# Patient Record
Sex: Female | Born: 1970 | Race: White | Hispanic: No | Smoking: Current some day smoker
Health system: Southern US, Community
[De-identification: ages and names within clinical notes are randomized; demographics above are authoritative.]

## PROBLEM LIST (undated history)

## (undated) DIAGNOSIS — N760 Acute vaginitis: Secondary | ICD-10-CM

## (undated) DIAGNOSIS — B9689 Other specified bacterial agents as the cause of diseases classified elsewhere: Secondary | ICD-10-CM

## (undated) DIAGNOSIS — F1910 Other psychoactive substance abuse, uncomplicated: Secondary | ICD-10-CM

## (undated) DIAGNOSIS — B1920 Unspecified viral hepatitis C without hepatic coma: Secondary | ICD-10-CM

## (undated) DIAGNOSIS — F418 Other specified anxiety disorders: Secondary | ICD-10-CM

## (undated) DIAGNOSIS — B009 Herpesviral infection, unspecified: Secondary | ICD-10-CM

## (undated) DIAGNOSIS — T401X1A Poisoning by heroin, accidental (unintentional), initial encounter: Secondary | ICD-10-CM

## (undated) HISTORY — PX: TUMOR REMOVAL: SHX12

## (undated) HISTORY — DX: Acute vaginitis: N76.0

## (undated) HISTORY — DX: Herpesviral infection, unspecified: B00.9

## (undated) HISTORY — DX: Other specified bacterial agents as the cause of diseases classified elsewhere: B96.89

## (undated) HISTORY — PX: CHOLECYSTECTOMY: SHX55

---

## 2002-05-02 ENCOUNTER — Emergency Department (HOSPITAL_COMMUNITY): Admission: EM | Admit: 2002-05-02 | Discharge: 2002-05-02 | Payer: Self-pay | Admitting: Emergency Medicine

## 2003-11-03 ENCOUNTER — Emergency Department (HOSPITAL_COMMUNITY): Admission: EM | Admit: 2003-11-03 | Discharge: 2003-11-03 | Payer: Self-pay

## 2004-03-07 ENCOUNTER — Emergency Department (HOSPITAL_COMMUNITY): Admission: EM | Admit: 2004-03-07 | Discharge: 2004-03-07 | Payer: Self-pay | Admitting: Emergency Medicine

## 2004-03-28 ENCOUNTER — Emergency Department (HOSPITAL_COMMUNITY): Admission: EM | Admit: 2004-03-28 | Discharge: 2004-03-28 | Payer: Self-pay | Admitting: Emergency Medicine

## 2005-01-14 ENCOUNTER — Emergency Department (HOSPITAL_COMMUNITY): Admission: EM | Admit: 2005-01-14 | Discharge: 2005-01-14 | Payer: Self-pay | Admitting: Emergency Medicine

## 2006-04-02 ENCOUNTER — Emergency Department (HOSPITAL_COMMUNITY): Admission: EM | Admit: 2006-04-02 | Discharge: 2006-04-02 | Payer: Self-pay | Admitting: Emergency Medicine

## 2006-04-21 ENCOUNTER — Emergency Department (HOSPITAL_COMMUNITY): Admission: EM | Admit: 2006-04-21 | Discharge: 2006-04-21 | Payer: Self-pay | Admitting: Emergency Medicine

## 2006-06-12 ENCOUNTER — Encounter (HOSPITAL_COMMUNITY): Admission: RE | Admit: 2006-06-12 | Discharge: 2006-07-12 | Payer: Self-pay | Admitting: Family Medicine

## 2007-10-14 ENCOUNTER — Emergency Department (HOSPITAL_COMMUNITY): Admission: EM | Admit: 2007-10-14 | Discharge: 2007-10-14 | Payer: Self-pay | Admitting: Emergency Medicine

## 2007-10-16 ENCOUNTER — Emergency Department (HOSPITAL_COMMUNITY): Admission: EM | Admit: 2007-10-16 | Discharge: 2007-10-16 | Payer: Self-pay | Admitting: Emergency Medicine

## 2008-12-30 ENCOUNTER — Emergency Department (HOSPITAL_COMMUNITY): Admission: EM | Admit: 2008-12-30 | Discharge: 2008-12-30 | Payer: Self-pay | Admitting: Emergency Medicine

## 2008-12-31 ENCOUNTER — Ambulatory Visit (HOSPITAL_COMMUNITY): Admission: RE | Admit: 2008-12-31 | Discharge: 2008-12-31 | Payer: Self-pay | Admitting: Emergency Medicine

## 2009-01-22 ENCOUNTER — Ambulatory Visit: Payer: Self-pay | Admitting: General Surgery

## 2009-01-27 ENCOUNTER — Ambulatory Visit: Payer: Self-pay | Admitting: General Surgery

## 2009-06-21 ENCOUNTER — Inpatient Hospital Stay: Payer: Self-pay | Admitting: Unknown Physician Specialty

## 2010-04-06 ENCOUNTER — Ambulatory Visit (HOSPITAL_COMMUNITY): Admission: RE | Admit: 2010-04-06 | Discharge: 2010-04-06 | Payer: Self-pay | Admitting: Nurse Practitioner

## 2010-11-28 ENCOUNTER — Encounter: Payer: Self-pay | Admitting: Emergency Medicine

## 2010-11-28 ENCOUNTER — Encounter: Payer: Self-pay | Admitting: Nurse Practitioner

## 2011-02-22 LAB — DIFFERENTIAL
Basophils Relative: 0 % (ref 0–1)
Eosinophils Relative: 4 % (ref 0–5)
Monocytes Absolute: 0.8 10*3/uL (ref 0.1–1.0)
Monocytes Relative: 8 % (ref 3–12)

## 2011-02-22 LAB — URINALYSIS, ROUTINE W REFLEX MICROSCOPIC
Glucose, UA: NEGATIVE mg/dL
Hgb urine dipstick: NEGATIVE
Ketones, ur: NEGATIVE mg/dL
Nitrite: NEGATIVE
Protein, ur: NEGATIVE mg/dL
pH: 6 (ref 5.0–8.0)

## 2011-02-22 LAB — COMPREHENSIVE METABOLIC PANEL
AST: 229 U/L — ABNORMAL HIGH (ref 0–37)
Albumin: 3.9 g/dL (ref 3.5–5.2)
CO2: 27 mEq/L (ref 19–32)
Creatinine, Ser: 0.57 mg/dL (ref 0.4–1.2)
GFR calc Af Amer: >60 mL/min (ref >60)
Glucose, Bld: 82 mg/dL (ref 70–99)
Potassium: 3.8 mEq/L (ref 3.5–5.1)
Sodium: 140 mEq/L (ref 135–145)
Total Bilirubin: 0.5 mg/dL (ref 0.3–1.2)
Total Protein: 7 g/dL (ref 6.0–8.3)

## 2011-02-22 LAB — CBC
MCV: 93.6 fL (ref 78.0–100.0)
RDW: 12.5 % (ref 11.5–15.5)

## 2015-02-09 ENCOUNTER — Encounter: Payer: Self-pay | Admitting: Women's Health

## 2015-02-09 ENCOUNTER — Other Ambulatory Visit: Payer: Self-pay | Admitting: Women's Health

## 2015-02-25 ENCOUNTER — Other Ambulatory Visit: Payer: Self-pay | Admitting: Women's Health

## 2015-03-10 ENCOUNTER — Other Ambulatory Visit: Payer: Self-pay | Admitting: Women's Health

## 2015-03-18 ENCOUNTER — Other Ambulatory Visit: Payer: Self-pay | Admitting: Women's Health

## 2015-03-24 ENCOUNTER — Other Ambulatory Visit: Payer: Self-pay | Admitting: Women's Health

## 2015-03-31 ENCOUNTER — Other Ambulatory Visit (HOSPITAL_COMMUNITY)
Admission: RE | Admit: 2015-03-31 | Discharge: 2015-03-31 | Disposition: A | Discharge status: Home / Self Care | Payer: Medicaid Other | Source: Ambulatory Visit | Attending: Obstetrics & Gynecology | Admitting: Obstetrics & Gynecology

## 2015-03-31 ENCOUNTER — Encounter: Payer: Self-pay | Admitting: Women's Health

## 2015-03-31 ENCOUNTER — Ambulatory Visit (INDEPENDENT_AMBULATORY_CARE_PROVIDER_SITE_OTHER): Payer: Medicaid Other | Admitting: Women's Health

## 2015-03-31 VITALS — BP 94/62 | HR 64 | Ht 65.75 in | Wt 133.0 lb

## 2015-03-31 DIAGNOSIS — Z8639 Personal history of other endocrine, nutritional and metabolic disease: Secondary | ICD-10-CM

## 2015-03-31 DIAGNOSIS — Z30431 Encounter for routine checking of intrauterine contraceptive device: Secondary | ICD-10-CM

## 2015-03-31 DIAGNOSIS — Z113 Encounter for screening for infections with a predominantly sexual mode of transmission: Secondary | ICD-10-CM | POA: Insufficient documentation

## 2015-03-31 DIAGNOSIS — Z01419 Encounter for gynecological examination (general) (routine) without abnormal findings: Secondary | ICD-10-CM

## 2015-03-31 DIAGNOSIS — F17200 Nicotine dependence, unspecified, uncomplicated: Secondary | ICD-10-CM | POA: Insufficient documentation

## 2015-03-31 DIAGNOSIS — Z308 Encounter for other contraceptive management: Secondary | ICD-10-CM | POA: Diagnosis not present

## 2015-03-31 DIAGNOSIS — Z01411 Encounter for gynecological examination (general) (routine) with abnormal findings: Secondary | ICD-10-CM | POA: Diagnosis present

## 2015-03-31 DIAGNOSIS — Z1151 Encounter for screening for human papillomavirus (HPV): Secondary | ICD-10-CM | POA: Diagnosis present

## 2015-03-31 NOTE — Progress Notes (Signed)
Patient ID: Alyssa Norris, female   DOB: November 23, 1970, 44 y.o.   MRN: [id number] Subjective:   Alyssa Norris is a 44 y.o. G77P2012 Caucasian female here for a routine well-woman exam.  No LMP recorded. Patient is not currently having periods (Reason: IUD).    IUD placed 2010, wants to remove and have new one placed Current complaints: none PCP: [facility], however her PCP left       Does desire labs. Has h/o thyroid nodule- normal thyroid scan/uptake 24hr 2007, doesn't think she's had tsh since them.  Has CBC & CMET that she had done in Feb at ED in [place], they were normal  Social History: Sexual: heterosexual Marital Status: dating Living situation: with mother Occupation: unemployed Tobacco/alcohol: tobacco: may 2-3 cigarettes about twice/wk, etoh: occ Illicit drugs: no history of illicit drug use  The following portions of the patient's history were reviewed and updated as appropriate: allergies, current medications, past family history, past medical history, past social history, past surgical history and problem list.  Past Medical History History reviewed. No pertinent past medical history.  Past Surgical History Past Surgical History  Procedure Laterality Date  . Tumor removal      abdomen @ age 22  . Cholecystectomy      Gynecologic History No obstetric history on file.  No LMP recorded. Patient is not currently having periods (Reason: IUD). Contraception: IUD, Mirena placed 2010 in [place] Last Pap: >74yrs ago at [facility]. Results were: normal Last mammogram: @ 44yo 'baseline'. Results were: normal Last TCS: never  Obstetric History OB History  No data available    Current Medications No current outpatient prescriptions on file prior to visit.   No current facility-administered medications on file prior to visit.    Review of Systems Patient denies any headaches, blurred vision, shortness of breath, chest pain, abdominal  pain, problems with bowel movements, urination, or intercourse.  Objective:  BP 94/62 mmHg  Pulse 64  Ht 5' 5.75" (1.67 m)  Wt 133 lb (60.328 kg)  BMI 21.63 kg/m2 Physical Exam  General:  Well developed, well nourished, no acute distress. She is alert and oriented x3. Skin:  Warm and dry Neck:  Midline trachea, no thyromegaly or nodules Cardiovascular: Regular rate and rhythm, no murmur heard Lungs:  Effort normal, all lung fields clear to auscultation bilaterally Breasts:  No dominant palpable mass, retraction, or nipple discharge ~1cm mole Rt outer lateral breast, one color, mostly regular Abdomen:  Soft, non tender, no hepatosplenomegaly or masses Pelvic:  External genitalia is normal in appearance.  The vagina is normal in appearance. The cervix is bulbous, no CMT.  Thin prep pap is done w/ HR HPV cotesting. Uterus is felt to be normal size, shape, and contour.  No adnexal masses or tenderness noted. Extremities:  No swelling or varicosities noted Psych:  She has a normal mood and affect  Assessment:   Healthy well-woman exam STI screening H/O hyperthyroidism w/ thyroid nodule Large nevi Rt breast Smoker  Plan:  GC/CT from pap today F/U 1wk for fasting labs (TSH & Lipid panel- will be self pay d/t FP [facility] not covering- gave expected prices for both), HIV, RPR, HSV2, HepB in AM, then appt for IUD removal and reinsertion Mammogram: needs screening mammo now, past due, however her insurance won't pay- gave info for BCEP @ [facility] or scholarship program at [facility] Recommended seeing derm for large nevi Rt breast Colonoscopy  or sooner if problems Advised complete smoking  cessation  Marge DuncansBooker, Kimberly Randall CNM, [facility]NP-BC 03/31/2015 3:43 PM

## 2015-03-31 NOTE — Patient Instructions (Signed)
TSH (thyroid level) $ 43, Lipid panel (cholesterol levels) $40.81, $10 fee to draw your blood  Nothing to eat or drink after midnight the night before you come for labs  Mammogram: [facility] and ask about the [place], [phone] (or [place]), [facility] [place]- [facility]

## 2015-04-02 LAB — CYTOLOGY - PAP

## 2015-04-08 ENCOUNTER — Ambulatory Visit: Payer: Medicaid Other | Admitting: Women's Health

## 2015-04-14 ENCOUNTER — Ambulatory Visit: Payer: Medicaid Other | Admitting: Women's Health

## 2015-04-22 ENCOUNTER — Ambulatory Visit (INDEPENDENT_AMBULATORY_CARE_PROVIDER_SITE_OTHER): Payer: Medicaid Other | Admitting: Women's Health

## 2015-04-22 ENCOUNTER — Encounter: Payer: Self-pay | Admitting: Women's Health

## 2015-04-22 VITALS — BP 104/62 | HR 68 | Wt 133.0 lb

## 2015-04-22 DIAGNOSIS — Z30433 Encounter for removal and reinsertion of intrauterine contraceptive device: Secondary | ICD-10-CM

## 2015-04-22 DIAGNOSIS — Z309 Encounter for contraceptive management, unspecified: Secondary | ICD-10-CM

## 2015-04-22 DIAGNOSIS — Z30430 Encounter for insertion of intrauterine contraceptive device: Secondary | ICD-10-CM

## 2015-04-22 DIAGNOSIS — Z3202 Encounter for pregnancy test, result negative: Secondary | ICD-10-CM

## 2015-04-22 LAB — POCT URINE PREGNANCY: Preg Test, Ur: NEGATIVE

## 2015-04-22 NOTE — Progress Notes (Signed)
Patient ID: Alyssa Norris, female   DOB: [birth year], 44 y.o.   MRN: [id number] Alyssa Norris is a 44 y.o. year old Caucasian female who presents for removal and replacement of a Mirena IUD. She was given informed consent for removal and reinsertion of her Mirena. Her Mirena was placed 2010, No LMP recorded. Patient is not currently having periods (Reason: IUD)., and her pregnancy test today was negative.   The risks and benefits of the method and placement have been thouroughly reviewed with the patient and all questions were answered.  Specifically the patient is aware of failure rate of 11/998, expulsion of the IUD and of possible perforation.  The patient is aware of irregular bleeding due to the method and understands the incidence of irregular bleeding diminishes with time.  Signed copy of informed consent in chart.   No LMP recorded. Patient is not currently having periods (Reason: IUD). BP 104/62 mmHg  Pulse 68  Wt 133 lb (60.328 kg) Results for orders placed or performed in visit on 04/22/15 (from the past 24 hour(s))  POCT urine pregnancy   Collection Time: 04/22/15  3:30 PM  Result Value Ref Range   Preg Test, Ur Negative Negative     Appropriate time out taken. A graves speculum was placed in the vagina.  The cervix was visualized, prepped using Betadine. The strings were visible. They were grasped and the Mirena was easily removed by Luna Kitchens, SNM. The cervix was then grasped with a single-tooth tenaculum. The uterus was found to be anteroflexed and it sounded to 7 cm.  Mirena IUD placed per manufacturer's recommendations without complications by Luna Kitchens, SNM. The strings were trimmed to 3 cm.  The patient tolerated the procedure well.   Sonogram was performed and the proper placement of the IUD was verified via transvaginal u/s by [facility] & amber, ultrasonographer.   The patient was given post procedure instructions, including signs and symptoms of infection and  to check for the strings after each menses or each month, and refraining from intercourse or anything in the vagina for 3 days.  She was given a Mirena care card with date Mirena placed, and date Mirena to be removed.  She is scheduled for a f/u appointment in 4 weeks at which time she will get her yearly labs discussed at last visit.   Marge Duncans CNM, [facility] 04/22/2015 4:01 PM

## 2015-04-22 NOTE — Patient Instructions (Signed)
 Nothing in vagina for 3 days (no sex, douching, tampons, etc...)  Check your strings once a month to make sure you can feel them, if you are not able to please let us know  If you develop a fever of 100.4 or more in the next few weeks, or if you develop severe abdominal pain, please let us know  Use a backup method of birth control, such as condoms, for 2 weeks   Levonorgestrel intrauterine device (IUD) What is this medicine? LEVONORGESTREL IUD (LEE voe nor jes trel) is a contraceptive (birth control) device. The device is placed inside the uterus by a healthcare professional. It is used to prevent pregnancy and can also be used to treat heavy bleeding that occurs during your period. Depending on the device, it can be used for 3 to 5 years. This medicine may be used for other purposes; ask your health care provider or pharmacist if you have questions. COMMON BRAND NAME(S): LILETTA, Mirena, Skyla What should I tell my health care provider before I take this medicine? They need to know if you have any of these conditions: -abnormal Pap smear -cancer of the breast, uterus, or cervix -diabetes -endometritis -genital or pelvic infection now or in the past -have more than one sexual partner or your partner has more than one partner -heart disease -history of an ectopic or tubal pregnancy -immune system problems -IUD in place -liver disease or tumor -problems with blood clots or take blood-thinners -use intravenous drugs -uterus of unusual shape -vaginal bleeding that has not been explained -an unusual or allergic reaction to levonorgestrel, other hormones, silicone, or polyethylene, medicines, foods, dyes, or preservatives -pregnant or trying to get pregnant -breast-feeding How should I use this medicine? This device is placed inside the uterus by a health care professional. Talk to your pediatrician regarding the use of this medicine in children. Special care may be  needed. Overdosage: If you think you have taken too much of this medicine contact a poison control center or emergency room at once. NOTE: This medicine is only for you. Do not share this medicine with others. What if I miss a dose? This does not apply. What may interact with this medicine? Do not take this medicine with any of the following medications: -amprenavir -bosentan -fosamprenavir This medicine may also interact with the following medications: -aprepitant -barbiturate medicines for inducing sleep or treating seizures -bexarotene -griseofulvin -medicines to treat seizures like carbamazepine, ethotoin, felbamate, oxcarbazepine, phenytoin, topiramate -modafinil -pioglitazone -rifabutin -rifampin -rifapentine -some medicines to treat HIV infection like atazanavir, indinavir, lopinavir, nelfinavir, tipranavir, ritonavir -St. John's wort -warfarin This list may not describe all possible interactions. Give your health care provider a list of all the medicines, herbs, non-prescription drugs, or dietary supplements you use. Also tell them if you smoke, drink alcohol, or use illegal drugs. Some items may interact with your medicine. What should I watch for while using this medicine? Visit your doctor or health care professional for regular check ups. See your doctor if you or your partner has sexual contact with others, becomes HIV positive, or gets a sexual transmitted disease. This product does not protect you against HIV infection (AIDS) or other sexually transmitted diseases. You can check the placement of the IUD yourself by reaching up to the top of your vagina with clean fingers to feel the threads. Do not pull on the threads. It is a good habit to check placement after each menstrual period. Call your doctor right away if you feel   more of the IUD than just the threads or if you cannot feel the threads at all. The IUD may come out by itself. You may become pregnant if the device  comes out. If you notice that the IUD has come out use a backup birth control method like condoms and call your health care provider. Using tampons will not change the position of the IUD and are okay to use during your period. What side effects may I notice from receiving this medicine? Side effects that you should report to your doctor or health care professional as soon as possible: -allergic reactions like skin rash, itching or hives, swelling of the face, lips, or tongue -fever, flu-like symptoms -genital sores -high blood pressure -no menstrual period for 6 weeks during use -pain, swelling, warmth in the leg -pelvic pain or tenderness -severe or sudden headache -signs of pregnancy -stomach cramping -sudden shortness of breath -trouble with balance, talking, or walking -unusual vaginal bleeding, discharge -yellowing of the eyes or skin Side effects that usually do not require medical attention (report to your doctor or health care professional if they continue or are bothersome): -acne -breast pain -change in sex drive or performance -changes in weight -cramping, dizziness, or faintness while the device is being inserted -headache -irregular menstrual bleeding within first 3 to 6 months of use -nausea This list may not describe all possible side effects. Call your doctor for medical advice about side effects. You may report side effects to FDA at 1-800-FDA-1088. Where should I keep my medicine? This does not apply. NOTE: This sheet is a summary. It may not cover all possible information. If you have questions about this medicine, talk to your doctor, pharmacist, or health care provider.  2015, Elsevier/Gold Standard. (2011-11-24 13:54:04)  

## 2015-05-05 ENCOUNTER — Ambulatory Visit (INDEPENDENT_AMBULATORY_CARE_PROVIDER_SITE_OTHER): Payer: Medicaid Other | Admitting: Obstetrics & Gynecology

## 2015-05-05 ENCOUNTER — Encounter: Payer: Self-pay | Admitting: Obstetrics & Gynecology

## 2015-05-05 VITALS — BP 90/60 | HR 80 | Wt 132.0 lb

## 2015-05-05 DIAGNOSIS — Z30431 Encounter for routine checking of intrauterine contraceptive device: Secondary | ICD-10-CM | POA: Diagnosis not present

## 2015-05-05 DIAGNOSIS — N719 Inflammatory disease of uterus, unspecified: Secondary | ICD-10-CM

## 2015-05-05 MED ORDER — METRONIDAZOLE 500 MG PO TABS: 500.0000 mg | ORAL_TABLET | Freq: Two times a day (BID) | ORAL | Status: DC

## 2015-05-05 NOTE — Progress Notes (Signed)
Patient ID: Alyssa Norris, female   DOB: 26-May-1971, 44 y.o.   MRN: [id number]   Chief Complaint  Patient presents with  . gyn visit    check IUD.c/c cramping/ back pain and vaginal odor.     HPI:    44 y.o. No obstetric history on file. No LMP recorded. Patient is not currently having periods (Reason: IUD).  Had placed 6/15 having unpleasant odor and back and pelvic cramping Location:  pelvic. Quality:  crampy. Severity:  moderate. Timing:  episodic. Duration:  5-6 days. Context:  .7 days or so after IUD placed about 2 days after intercourse Modifying factors:   Signs/Symptoms:      Current outpatient prescriptions:  .  metroNIDAZOLE (FLAGYL) 500 MG tablet, Take 1 tablet (500 mg total) by mouth 2 (two) times daily., Disp: 14 tablet, Rfl: 0  Problem Pertinent ROS:       No burning with urination, frequency or urgency No nausea, vomiting or diarrhea Nor fever chills or other constitutional symptoms   Extended ROS:        PMFSH:             History reviewed. No pertinent past medical history.  Past Surgical History  Procedure Laterality Date  . Tumor removal      abdomen @ age 1  . Cholecystectomy      OB History    No data available      Allergies  Allergen Reactions  . Erythromycin Hives  . Penicillins Hives    History   Social History  . Marital Status: Married    Spouse Name: N/A  . Number of Children: N/A  . Years of Education: N/A   Social History Main Topics  . Smoking status: Current Some Day Smoker  . Smokeless tobacco: Not on file  . Alcohol Use: 0.0 oz/week    0 Standard drinks or equivalent per week  . Drug Use: No  . Sexual Activity: Yes    Birth Control/ Protection: IUD   Other Topics Concern  . None   Social History Narrative    Family History  Problem Relation Age of Onset  . Diabetes Mother   . Hypertension Mother   . Depression Mother   . Cancer Father     testicular  . Depression Maternal Grandmother   .  Heart disease Maternal Grandmother   . Hypertension Maternal Grandmother   . Cancer Paternal Grandfather      Examination:  Vitals:  Blood pressure 90/60, pulse 80, weight 132 lb (59.875 kg).    Physical Examination:     General Appearance:  awake, alert, oriented, in no acute distress  Vulva:  normal appearing vulva with no masses, tenderness or lesions Vagina:  normal mucosa, thin grey discharge Cervix:  normal with string visible Uterus:  normal size minimally tender Adnexa: ovaries:present,  normal adnexa in size, nontender and no masses  Wet Prep:   A sample of vaginal discharge was obtained from the posterior fornix using a cotton swab. 2 drops of saline were placed on a slide and the cotton swab was immersed in the saline. Microscopic evaluation was performed and results were as follows:  Negative  for yeast  Positive for clue cells , consistent with Bacterial vaginosis Negative for trichomonas  Abnormal- slightly heavier normal for post IUD WBC population   Whiff test: Positive     DATA orders and reviews: Labs wereordered today: wet prep  Imaging studies were not ordered today:  Lab tests were not reviewed today:    Imaging studies were not reviewed today:    I did not independently review/view images, tracing or specimen(not simply the report) myself.  Prescription Drug Management:  New Prescriptions: Metronidazole 500 BID x 7 days Renewed Prescriptions:   Current prescription changes:     Impression/Plan(Problem Based): 1.  Post IUD endometritits/Bacterial vaginosis changes      (new problem) : Additional workup is not needed:  metornidazole 500 bid x 7 days    Follow Up:   2  weeks       All questions were answered.

## 2015-05-12 ENCOUNTER — Telehealth: Payer: Self-pay | Admitting: Obstetrics and Gynecology

## 2015-05-12 MED ORDER — FLUCONAZOLE 100 MG PO TABS: 100.0000 mg | ORAL_TABLET | Freq: Every day | ORAL | Status: DC

## 2015-05-12 NOTE — Telephone Encounter (Signed)
Spoke with pt letting her know Diflucan was sent to pharmacy. JSY

## 2015-05-12 NOTE — Telephone Encounter (Signed)
Diflucan 100 qd x 7 for yeast

## 2015-05-19 ENCOUNTER — Encounter: Payer: Self-pay | Admitting: Obstetrics & Gynecology

## 2015-05-19 ENCOUNTER — Ambulatory Visit (INDEPENDENT_AMBULATORY_CARE_PROVIDER_SITE_OTHER): Payer: Self-pay | Admitting: Obstetrics & Gynecology

## 2015-05-19 VITALS — BP 110/70 | HR 72 | Wt 132.0 lb

## 2015-05-19 DIAGNOSIS — B370 Candidal stomatitis: Secondary | ICD-10-CM

## 2015-05-19 MED ORDER — FIRST-DUKES MOUTHWASH MT SUSP: 5.0000 mL | Freq: Three times a day (TID) | OROMUCOSAL | Status: DC

## 2015-05-19 NOTE — Progress Notes (Signed)
Patient ID: Alyssa Norris, female   DOB: 1971/08/11, 44 y.o.   MRN: [id number]  Chief Complaint  Patient presents with  . Follow-up    birth control pill/ yeast infection in mouth.   Blood pressure 110/70, pulse 72, weight 132 lb (59.875 kg).  Pt with post metronidazole symptoms of oral yeast   Impression/Plan(Problem Based): 1.  thrush      (new problem) : Additional workup is not needed:  [facility] mouthwas   Prescription Drug Management:   New Prescriptions: [facility] miracle Renewed Prescriptions:   Current prescription changes:     Follow Up:   prn       Face to face time:  10 minutes  Greater than 50% of the visit time was spent in counseling and coordination of care with the patient.  The summary and outline of the counseling and care coordination is summarized in the note above.   All questions were answered.   Lazaro ArmsEURE,LUTHER H, MD

## 2015-05-20 ENCOUNTER — Ambulatory Visit: Payer: Medicaid Other | Admitting: Obstetrics & Gynecology

## 2015-05-20 ENCOUNTER — Ambulatory Visit: Payer: Medicaid Other | Admitting: Women's Health

## 2015-05-21 LAB — LIPID PANEL
CHOL/HDL RATIO: 2.8 ratio (ref 0.0–4.4)
CHOLESTEROL TOTAL: 195 mg/dL (ref 100–199)
HDL: 69 mg/dL (ref >39)
LDL Calculated: 110 mg/dL — ABNORMAL HIGH (ref 0–99)
TRIGLYCERIDES: 81 mg/dL (ref 0–149)
VLDL Cholesterol Cal: 16 mg/dL (ref 5–40)

## 2015-05-21 LAB — TSH: TSH: 0.484 u[IU]/mL (ref 0.450–4.500)

## 2015-05-21 LAB — RPR: RPR: NONREACTIVE

## 2015-05-21 LAB — HIV ANTIBODY (ROUTINE TESTING W REFLEX): HIV Screen 4th Generation wRfx: NONREACTIVE

## 2015-05-21 LAB — HSV 2 ANTIBODY, IGG: HSV 2 Glycoprotein G Ab, IgG: 16.8 index — ABNORMAL HIGH (ref 0.00–0.90)

## 2015-05-21 LAB — HEPATITIS B SURFACE ANTIGEN: HEP B S AG: NEGATIVE

## 2015-05-25 ENCOUNTER — Telehealth: Payer: Self-pay | Admitting: Women's Health

## 2015-05-25 ENCOUNTER — Encounter: Payer: Self-pay | Admitting: Women's Health

## 2015-05-25 DIAGNOSIS — R768 Other specified abnormal immunological findings in serum: Secondary | ICD-10-CM | POA: Insufficient documentation

## 2015-05-25 DIAGNOSIS — R7689 Other specified abnormal immunological findings in serum: Secondary | ICD-10-CM | POA: Insufficient documentation

## 2015-05-25 NOTE — Telephone Encounter (Signed)
Attempted to call pt x 2- phone rings once then stops ringing. Need to notify of lab results, will send message via mychart.  Cheral MarkerKimberly R. Booker, CNM, [facility] 05/25/2015 12:04 PM

## 2015-05-27 ENCOUNTER — Telehealth: Payer: Self-pay | Admitting: Women's Health

## 2015-05-27 NOTE — Telephone Encounter (Signed)
Tried to call pt again to notify of labs, unable to reach, will send mychart message.  Cheral MarkerKimberly R. Kyah Buesing, CNM, [facility]-BC 05/27/2015 4:22 PM

## 2015-06-01 ENCOUNTER — Encounter: Payer: Self-pay | Admitting: Women's Health

## 2015-06-24 ENCOUNTER — Ambulatory Visit: Payer: Medicaid Other | Admitting: Advanced Practice Midwife

## 2015-07-01 ENCOUNTER — Encounter: Payer: Self-pay | Admitting: Advanced Practice Midwife

## 2015-07-01 ENCOUNTER — Ambulatory Visit (INDEPENDENT_AMBULATORY_CARE_PROVIDER_SITE_OTHER): Payer: Medicaid Other | Admitting: Advanced Practice Midwife

## 2015-07-01 VITALS — BP 108/80 | HR 64 | Ht 66.0 in | Wt 134.0 lb

## 2015-07-01 DIAGNOSIS — Z309 Encounter for contraceptive management, unspecified: Secondary | ICD-10-CM

## 2015-07-01 DIAGNOSIS — Z3009 Encounter for other general counseling and advice on contraception: Secondary | ICD-10-CM

## 2015-07-01 DIAGNOSIS — N898 Other specified noninflammatory disorders of vagina: Secondary | ICD-10-CM

## 2015-07-01 MED ORDER — METRONIDAZOLE 0.75 % VA GEL: 1.0000 | Freq: Every day | VAGINAL | Status: DC

## 2015-07-01 MED ORDER — FLUOXETINE HCL 20 MG PO TABS: 20.0000 mg | ORAL_TABLET | Freq: Every day | ORAL | Status: DC

## 2015-07-01 MED ORDER — FLUCONAZOLE 150 MG PO TABS: ORAL_TABLET | ORAL | Status: DC

## 2015-07-01 MED ORDER — CIPROFLOXACIN HCL 500 MG PO TABS: 500.0000 mg | ORAL_TABLET | Freq: Two times a day (BID) | ORAL | Status: DC

## 2015-07-01 MED ORDER — CLINDAMYCIN PHOSPHATE 100 MG VA SUPP: 100.0000 mg | Freq: Every day | VAGINAL | Status: DC
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
## 2015-07-01 NOTE — Patient Instructions (Addendum)
RepHresh (or similar) vaginal probiotic.  Use twice a week before bed   [phone] (counseling)

## 2015-07-03 ENCOUNTER — Encounter: Payer: Self-pay | Admitting: Advanced Practice Midwife

## 2015-07-03 LAB — GC/CHLAMYDIA PROBE AMP
Chlamydia trachomatis, NAA: NEGATIVE
Neisseria gonorrhoeae by PCR: POSITIVE — AB

## 2015-07-04 ENCOUNTER — Encounter (HOSPITAL_COMMUNITY): Payer: Self-pay

## 2015-07-04 ENCOUNTER — Emergency Department (HOSPITAL_COMMUNITY)
Admission: EM | Admit: 2015-07-04 | Discharge: 2015-07-04 | Disposition: A | Discharge status: Home / Self Care | Payer: Medicaid Other | Attending: Emergency Medicine | Admitting: Emergency Medicine

## 2015-07-04 DIAGNOSIS — Z720 Tobacco use: Secondary | ICD-10-CM | POA: Insufficient documentation

## 2015-07-04 DIAGNOSIS — A549 Gonococcal infection, unspecified: Secondary | ICD-10-CM | POA: Insufficient documentation

## 2015-07-04 DIAGNOSIS — A64 Unspecified sexually transmitted disease: Secondary | ICD-10-CM

## 2015-07-04 DIAGNOSIS — Z792 Long term (current) use of antibiotics: Secondary | ICD-10-CM | POA: Insufficient documentation

## 2015-07-04 DIAGNOSIS — Z880 Allergy status to penicillin: Secondary | ICD-10-CM | POA: Insufficient documentation

## 2015-07-04 DIAGNOSIS — F419 Anxiety disorder, unspecified: Secondary | ICD-10-CM | POA: Insufficient documentation

## 2015-07-04 MED ORDER — DOXYCYCLINE HYCLATE 100 MG PO CAPS: 100.0000 mg | ORAL_CAPSULE | Freq: Two times a day (BID) | ORAL | Status: DC

## 2015-07-04 MED ORDER — LIDOCAINE HCL (PF) 1 % IJ SOLN
INTRAMUSCULAR | Status: AC
  Administered 2015-07-04: 5 mL
  Filled 2015-07-04: qty 5

## 2015-07-04 MED ORDER — DOXYCYCLINE HYCLATE 100 MG PO TABS
100.0000 mg | ORAL_TABLET | Freq: Once | ORAL | Status: AC
  Administered 2015-07-04: 100 mg via ORAL
  Filled 2015-07-04: qty 1

## 2015-07-04 MED ORDER — CEFTRIAXONE SODIUM 250 MG IJ SOLR
250.0000 mg | Freq: Once | INTRAMUSCULAR | Status: AC
  Administered 2015-07-04: 250 mg via INTRAMUSCULAR
  Filled 2015-07-04: qty 250

## 2015-07-04 NOTE — ED Notes (Signed)
edp went in to see pt but pt was in the restroom

## 2015-07-04 NOTE — ED Provider Notes (Addendum)
CSN: [id number]     Arrival date & time 07/04/15  0105 History   First MD Initiated Contact with Patient 07/04/15 0458   No chief complaint on file.    (Consider location/radiation/quality/duration/timing/severity/associated sxs/prior Treatment) HPI patient states she was tested for STDs in May and had a IUD inserted in June. She had bacterial vaginosis and was treated for that. She states about one and half weeks ago she started having vaginal discharge again with lower abdominal pain described as cramping. Patient was seen again by her GYN on August 24 and was retested. She states she has thought her significant other of the past year and a half may have been cheating on her. She was sent a text message tonight from her GYN office that her gonorrhea test was positive. She was given the option to come to the office or go to the health department however patient decided to come to the ED because "I want to get rid of it right now". She denies nausea, vomiting, fever, dysuria, or frequency.  [facility]  Past Medical History  Diagnosis Date  . BV (bacterial vaginosis)   . HSV (herpes simplex virus) infection    Past Surgical History  Procedure Laterality Date  . Tumor removal      abdomen @ age 20  . Cholecystectomy     Family History  Problem Relation Age of Onset  . Diabetes Mother   . Hypertension Mother   . Depression Mother   . Cancer Father     testicular  . Depression Maternal Grandmother   . Heart disease Maternal Grandmother   . Hypertension Maternal Grandmother   . Cancer Paternal Grandfather    Social History  Substance Use Topics  . Smoking status: Current Some Day Smoker  . Smokeless tobacco: Never Used  . Alcohol Use: 0.0 oz/week    0 Standard drinks or equivalent per week     Comment: occ   OB History    No data available     Review of Systems  All other systems reviewed and are negative.     Allergies  Coconut oil; Erythromycin; and  Penicillins  Home Medications   Prior to Admission medications   Medication Sig Start Date End Date Taking? Authorizing Provider  ciprofloxacin (CIPRO) 500 MG tablet Take 1 tablet (500 mg total) by mouth 2 (two) times daily. 07/01/15   Jacklyn Shell, CNM  clindamycin (CLEOCIN) 100 MG vaginal suppository Place 1 suppository (100 mg total) vaginally at bedtime. 07/01/15   Jacklyn Shell, CNM  doxycycline (VIBRAMYCIN) 100 MG capsule Take 1 capsule (100 mg total) by mouth 2 (two) times daily. 07/04/15   Devoria Albe, MD  fluconazole (DIFLUCAN) 150 MG tablet 1 po stat; repeat in 3 days 07/01/15   Jacklyn Shell, CNM  FLUoxetine (PROZAC) 20 MG tablet Take 1 tablet (20 mg total) by mouth daily. 07/01/15   Jacklyn Shell, CNM  metroNIDAZOLE (METROGEL VAGINAL) 0.75 % vaginal gel Place 1 Applicatorful vaginally at bedtime. For 5 nights 07/01/15   Jacklyn Shell, CNM   BP 100/55 mmHg  Pulse 73  Temp(Src) 98.3 F (36.8 C) (Oral)  Resp 16  Ht 5\' 6"  (1.676 m)  Wt 134 lb (60.782 kg)  BMI 21.64 kg/m2  SpO2 100%  Vital signs normal   Physical Exam  Constitutional: She is oriented to person, place, and time. She appears well-developed and well-nourished.  Non-toxic appearance. She does not appear ill. No distress.  HENT:  Head: Normocephalic and atraumatic.  Right Ear: External ear normal.  Left Ear: External ear normal.  Nose: Nose normal. No mucosal edema or rhinorrhea.  Mouth/Throat: Mucous membranes are normal. No dental abscesses or uvula swelling.  Eyes: Conjunctivae and EOM are normal.  Neck: Normal range of motion and full passive range of motion without pain.  Cardiovascular: Normal rate, regular rhythm and normal heart sounds.  Exam reveals no gallop and no friction rub.   No murmur heard. Pulmonary/Chest: Effort normal. No respiratory distress. She has no rhonchi. She exhibits no crepitus.  Abdominal: Soft. Normal appearance and bowel sounds  are normal. She exhibits no distension. There is tenderness. There is no rebound and no guarding.    Musculoskeletal: Normal range of motion.  Moves all extremities well. Ambulates normally  Neurological: She is alert and oriented to person, place, and time. She has normal strength. No cranial nerve deficit.  Skin: Skin is warm, dry and intact. No rash noted. No erythema. No pallor.  Psychiatric: Her speech is normal and behavior is normal. Her mood appears anxious.  Nursing note and vitals reviewed.   ED Course  Procedures (including critical care time)  Medications  doxycycline (VIBRA-TABS) tablet 100 mg (100 mg Oral Given 07/04/15 0614)  cefTRIAXone (ROCEPHIN) injection 250 mg (250 mg Intramuscular Given 07/04/15 0614)  lidocaine (PF) (XYLOCAINE) 1 % injection (5 mLs  Given 07/04/15 0613)   Pt states she has taken keflex before. She is unsure if she has taken a zpak before. She states her drug allergeries were from when she was an infant.   Patient has her IUD in place. She had a pregnancy test done 4 days ago at her GYN office which was negative.  Pt was observed after her injection. She had no side effects.   Labs Review Labs Reviewed - No data to display   Notes Recorded by Jacklyn Shell, CNM on 07/03/2015 at 6:37 PM Will treat with rocephin IM/azithromycin. Pt has no insurance to cover meds, so may refer to health department for treatment if she desires.     Ref Range 3d ago    Chlamydia trachomatis, NAA Negative  Negative   Neisseria gonorrhoeae by PCR Negative  Positive (A)   Resulting Agency [facility]     Narrative     Performed at: [address] [facility] [address] [place], [place] [id number] Lab Director: Mila Homer MD, Phone: [phone]       Specimen Collected: 07/01/15 3:00 PM Last Resulted: 07/03/15 5:40 AM                       MDM   Final diagnoses:  STD (female)  Gonorrhea    New Prescriptions   DOXYCYCLINE  (VIBRAMYCIN) 100 MG CAPSULE    Take 1 capsule (100 mg total) by mouth 2 (two) times daily.    Plan discharge  Devoria Albe, MD, Concha Pyo, MD 07/04/15 8295  Devoria Albe, MD 07/04/15 [phone]

## 2015-07-04 NOTE — Discharge Instructions (Signed)
Drink plenty of fluids. You can take aleve 2 tabs OTC twice a day for your pelvic pain. Take the doxycycline until gone. Have your sexual partner get treatment. Recheck if you get a fever, vomiting or worsening pain.    Gonorrhea Gonorrhea is an infection that can cause serious problems. If left untreated, the infection may:   Damage the female or female organs.   Cause women to be unable to have children (sterility).   Harm a fetus if the infected woman is pregnant.  It is important to get treatment for gonorrhea as soon as possible. It is also necessary that all your sexual partners be tested for the infection.  CAUSES  Gonorrhea is caused by bacteria called Neisseria gonorrhoeae. The infection is spread from person to person, usually by sexual contact (such as by anal, vaginal, or oral means). A newborn can contract the infection from his or her mother during birth.  SYMPTOMS  Some people with gonorrhea do not have symptoms. Symptoms may be different in females and males.  Females The most common symptoms are:   Pain in the lower abdomen.   Fever with or without chills.  Other symptoms include:   Abnormal vaginal discharge.   Painful intercourse.   Burning or itching of the vagina or lips of the vagina.   Abnormal vaginal bleeding.   Pain when urinating.   Long-lasting (chronic) pain in the lower abdomen, especially during menstruation or intercourse.   Inability to become pregnant.   Going into premature labor.   Irritation, pain, bleeding, or discharge from the rectum. This may occur if the infection was spread by anal sex.   Sore throat or swollen lymph nodes in the neck. This may occur if the infection was spread by oral sex.  Males The most common symptoms are:   Discharge from the penis.   Pain or burning during urination.   Pain or swelling in the testicles. Other symptoms may include:   Irritation, pain, bleeding, or discharge from the  rectum. This may occur if the infection was spread by anal sex.   Sore throat, fever, or swollen lymph nodes in the neck. This may occur if the infection was spread by oral sex.  DIAGNOSIS  A diagnosis is made after a physical exam is done and a sample of discharge is examined under a microscope for the presence of the bacteria. The discharge may be taken from the urethra, cervix, throat, or rectum.  TREATMENT  Gonorrhea is treated with antibiotic medicines. It is important for treatment to begin as soon as possible. Early treatment may prevent some problems from developing.  HOME CARE INSTRUCTIONS   Take medicines only as directed by your health care provider.   Take your antibiotic medicine as directed by your health care provider. Finish the antibiotic even if you start to feel better. Incomplete treatment will put you at risk for continued infection.   Do not have sex until treatment is complete or as directed by your health care provider.   Keep all follow-up visits as directed by your health care provider.   Not all test results are available during your visit. If your test results are not back during the visit, make an appointment with your health care provider to find out the results. Do not assume everything is normal if you have not heard from your health care provider or the medical facility. It is your responsibility to get your test results.  If you test positive for gonorrhea,  inform your recent sexual partners. They need to be checked for gonorrhea even if they do not have symptoms. They may need treatment, even if they test negative for gonorrhea.  SEEK MEDICAL CARE IF:   You develop any bad reaction to the medicine you were prescribed. This may include:   A rash.   Nausea.   Vomiting.   Diarrhea.   Your symptoms do not improve after a few days of taking antibiotics.   Your symptoms get worse.   You develop increased pain, such as in the testicles  (for males) or in the abdomen (for females).  You have a fever. MAKE SURE YOU:   Understand these instructions.  Will watch your condition.  Will get help right away if you are not doing well or get worse. Document Released: 10/21/2000 Document Revised: 03/10/2014 Document Reviewed: 05/01/2013 [facility] Patient Information 2015 [place], [place]. This information is not intended to replace advice given to you by your health care provider. Make sure you discuss any questions you have with your health care provider.  Sexually Transmitted Disease A sexually transmitted disease (STD) is a disease or infection often passed to another person during sex. However, STDs can be passed through nonsexual ways. An STD can be passed through:  Spit (saliva).  Semen.  Blood.  Mucus from the vagina.  Pee (urine). HOW CAN I LESSEN MY CHANCES OF GETTING AN STD?  Use:  Latex condoms.  Water-soluble lubricants with condoms. Do not use petroleum jelly or oils.  Dental dams. These are small pieces of latex that are used as a barrier during oral sex.  Avoid having more than one sex partner.  Do not have sex with someone who has other sex partners.  Do not have sex with anyone you do not know or who is at high risk for an STD.  Avoid risky sex that can break your skin.  Do not have sex if you have open sores on your mouth or skin.  Avoid drinking too much alcohol or taking illegal drugs. Alcohol and drugs can affect your good judgment.  Avoid oral and anal sex acts.  Get shots (vaccines) for HPV and hepatitis.  If you are at risk of being infected with HIV, it is advised that you take a certain medicine daily to prevent HIV infection. This is called pre-exposure prophylaxis (PrEP). You may be at risk if:  You are a man who has sex with other men (MSM).  You are attracted to the opposite sex (heterosexual) and are having sex with more than one partner.  You take drugs with a  needle.  You have sex with someone who has HIV.  Talk with your doctor about if you are at high risk of being infected with HIV. If you begin to take PrEP, get tested for HIV first. Get tested every 3 months for as long as you are taking PrEP. WHAT SHOULD I DO IF I THINK I HAVE AN STD?  See your doctor.  Tell your sex partner(s) that you have an STD. They should be tested and treated.  Do not have sex until your doctor says it is okay. WHEN SHOULD I GET HELP? Get help right away if:  You have bad belly (abdominal) pain.  You are a man and have puffiness (swelling) or pain in your testicles.  You are a woman and have puffiness in your vagina. Document Released: 12/01/2004 Document Revised: 10/29/2013 Document Reviewed: 04/19/2013 [facility] Patient Information 2015 [place], [place]. This information  is not intended to replace advice given to you by your health care provider. Make sure you discuss any questions you have with your health care provider.

## 2015-07-04 NOTE — ED Notes (Signed)
[facility] and was informed by email that I had gonorrhea and that I needed to be treated immediately.

## 2015-07-08 ENCOUNTER — Encounter: Payer: Self-pay | Admitting: Advanced Practice Midwife

## 2015-07-08 DIAGNOSIS — A549 Gonococcal infection, unspecified: Secondary | ICD-10-CM | POA: Insufficient documentation

## 2015-07-15 ENCOUNTER — Telehealth: Payer: Self-pay | Admitting: Advanced Practice Midwife

## 2015-07-15 MED ORDER — AZITHROMYCIN 500 MG PO TABS: 1000.0000 mg | ORAL_TABLET | Freq: Once | ORAL | Status: DC

## 2015-07-15 NOTE — Telephone Encounter (Signed)
Pt had unprotected sex with the man who gave her GC after she was already treated.  Advised that pills are only 1/2 the treatment and she would need injection.  Pt aware. rx azithromycin 1gm X1

## 2015-07-15 NOTE — Telephone Encounter (Signed)
Pt states she had a positive GC/CHL on 07/01/2015, no POC. Does not have insurance requesting a refill on antibiotic. Pt states she does not have the money to pay to be seen.

## 2015-07-15 NOTE — Telephone Encounter (Signed)
No answer  LM 1:09 PM

## 2016-07-30 ENCOUNTER — Emergency Department (HOSPITAL_COMMUNITY)
Admission: EM | Admit: 2016-07-30 | Discharge: 2016-07-30 | Disposition: A | Discharge status: Home / Self Care | Payer: Self-pay | Attending: Emergency Medicine | Admitting: Emergency Medicine

## 2016-07-30 ENCOUNTER — Encounter (HOSPITAL_COMMUNITY): Payer: Self-pay | Admitting: Emergency Medicine

## 2016-07-30 ENCOUNTER — Emergency Department (HOSPITAL_COMMUNITY): Payer: Self-pay

## 2016-07-30 DIAGNOSIS — S93401A Sprain of unspecified ligament of right ankle, initial encounter: Secondary | ICD-10-CM | POA: Insufficient documentation

## 2016-07-30 DIAGNOSIS — Y939 Activity, unspecified: Secondary | ICD-10-CM | POA: Insufficient documentation

## 2016-07-30 DIAGNOSIS — F17200 Nicotine dependence, unspecified, uncomplicated: Secondary | ICD-10-CM | POA: Insufficient documentation

## 2016-07-30 DIAGNOSIS — Y999 Unspecified external cause status: Secondary | ICD-10-CM | POA: Insufficient documentation

## 2016-07-30 DIAGNOSIS — W010XXA Fall on same level from slipping, tripping and stumbling without subsequent striking against object, initial encounter: Secondary | ICD-10-CM | POA: Insufficient documentation

## 2016-07-30 DIAGNOSIS — Y92000 Kitchen of unspecified non-institutional (private) residence as  the place of occurrence of the external cause: Secondary | ICD-10-CM | POA: Insufficient documentation

## 2016-07-30 MED ORDER — IBUPROFEN 800 MG PO TABS: 800.0000 mg | ORAL_TABLET | Freq: Three times a day (TID) | ORAL | 0 refills | Status: DC

## 2016-07-30 NOTE — ED Provider Notes (Signed)
[facility] Provider Note   CSN: [id number] Arrival date & time: 07/30/16  1402     History   Chief Complaint Chief Complaint  Patient presents with  . Fall    HPI Alyssa Norris is a 45 y.o. female.  HPI  Alyssa Norris is a 45 y.o. female who presents to the Emergency Department complaining of Right ankle pain for several hours. She reports a mechanical fall at home that occurred when she slipped and fell in some cooking oil.  She reports a twisting injury to the ankle and pain with weightbearing. She also describes pins and needles tingling to her right foot. She has applied ice and taken ibuprofen with minimal relief. She reports previous injuries to the same ankle.  She denies head injury, back pain, knee pain or numbness of her toes.   Past Medical History:  Diagnosis Date  . BV (bacterial vaginosis)   . HSV (herpes simplex virus) infection     Patient Active Problem List   Diagnosis Date Noted  . Gonorrhea 07/08/2015  . 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 History of hyperthyroidism 03/31/2015  . History of thyroid nodule 03/31/2015  . Smoker 03/31/2015    Past Surgical History:  Procedure Laterality Date  . CHOLECYSTECTOMY    . TUMOR REMOVAL     abdomen @ age 82    OB History    Gravida Para Term Preterm AB Living   3 2 2   1      SAB TAB Ectopic Multiple Live Births     1             Home Medications    Prior to Admission medications   Medication Sig Start Date End Date Taking? Authorizing Provider  azithromycin (ZITHROMAX) 500 MG tablet Take 2 tablets (1,000 mg total) by mouth once. 07/15/15   Jacklyn Shell, CNM  ciprofloxacin (CIPRO) 500 MG tablet Take 1 tablet (500 mg total) by mouth 2 (two) times daily. 07/01/15   Jacklyn Shell, CNM  clindamycin (CLEOCIN) 100 MG vaginal suppository Place 1 suppository (100 mg total) vaginally at bedtime. 07/01/15   Jacklyn Shell, CNM  doxycycline (VIBRAMYCIN) 100 MG capsule  Take 1 capsule (100 mg total) by mouth 2 (two) times daily. 07/04/15   Devoria Albe, MD  fluconazole (DIFLUCAN) 150 MG tablet 1 po stat; repeat in 3 days 07/01/15   Jacklyn Shell, CNM  FLUoxetine (PROZAC) 20 MG tablet Take 1 tablet (20 mg total) by mouth daily. 07/01/15   Jacklyn Shell, CNM  metroNIDAZOLE (METROGEL VAGINAL) 0.75 % vaginal gel Place 1 Applicatorful vaginally at bedtime. For 5 nights 07/01/15   Jacklyn Shell, CNM    Family History Family History  Problem Relation Age of Onset  . Diabetes Mother   . Hypertension Mother   . Depression Mother   . Cancer Father     testicular  . Depression Maternal Grandmother   . Heart disease Maternal Grandmother   . Hypertension Maternal Grandmother   . Cancer Paternal Grandfather     Social History Social History  Substance Use Topics  . Smoking status: Current Some Day Smoker  . Smokeless tobacco: Never Used  . Alcohol use 0.0 oz/week     Comment: occ     Allergies   Coconut oil; Erythromycin; and Penicillins   Review of Systems Review of Systems  Constitutional: Negative for chills and fever.  Musculoskeletal: Positive for arthralgias (Right ankle pain) and joint swelling. Negative for back pain and neck pain.  Skin: Negative for color change and wound.  Neurological: Negative for dizziness, syncope and weakness.  All other systems reviewed and are negative.    Physical Exam Updated Vital Signs BP 109/65   Pulse 82   Temp 98.2 F (36.8 C) (Temporal)   Resp 16   Ht 5' 6.5" (1.689 m)   Wt 65.8 kg   SpO2 99%   BMI 23.05 kg/m   Physical Exam  Constitutional: She is oriented to person, place, and time. She appears well-developed and well-nourished. No distress.  HENT:  Head: Normocephalic and atraumatic.  Cardiovascular: Normal rate, regular rhythm, normal heart sounds and intact distal pulses.   Pulmonary/Chest: Effort normal and breath sounds normal.  Musculoskeletal: She exhibits  edema and tenderness. She exhibits no deformity.  Tenderness to palpation of the lateral right ankle and dorsal foot. Minimal edema present. DP pulse is brisk,distal sensation intact.  No erythema, abrasion, bruising or bony deformity.  No proximal tenderness.  Compartments are soft  Neurological: She is alert and oriented to person, place, and time. She exhibits normal muscle tone. Coordination normal.  Skin: Skin is warm and dry.  Nursing note and vitals reviewed.    ED Treatments / Results  Labs (all labs ordered are listed, but only abnormal results are displayed) Labs Reviewed - No data to display  EKG  EKG Interpretation None       Radiology Dg Ankle Complete Right  Result Date: 07/30/2016 CLINICAL DATA:  Lateral right ankle pain after a fall in her kitchen this afternoon. EXAM: RIGHT ANKLE - COMPLETE 3+ VIEW COMPARISON:  01/14/2005. FINDINGS: There is no evidence of fracture, dislocation, or joint effusion. There is no evidence of arthropathy or other focal bone abnormality. Soft tissues are unremarkable. IMPRESSION: No fracture. Electronically Signed   By: Beckie SaltsSteven  Reid M.D.   On: 07/30/2016 14:51   Dg Foot Complete Right  Result Date: 07/30/2016 CLINICAL DATA:  Right foot pain after falling in her kitchen today. EXAM: RIGHT FOOT COMPLETE - 3+ VIEW COMPARISON:  Right ankle radiographs obtained at the same time. FINDINGS: Cyst in the second proximal phalanx.  No fracture or dislocation. IMPRESSION: No fracture. Electronically Signed   By: Beckie SaltsSteven  Reid M.D.   On: 07/30/2016 14:52    Procedures Procedures (including critical care time)  Medications Ordered in ED Medications - No data to display   Initial Impression / Assessment and Plan / ED Course  I have reviewed the triage vital signs and the nursing notes.  Pertinent labs & imaging results that were available during my care of the patient were reviewed by me and considered in my medical decision making (see chart for  details).  Clinical Course    Patient was likely sprain of the right ankle. Remains neurovascular intact.  ASO splint applied. Patient has crutches at home. She agrees to RICE therapy and close orthopedic follow-up if needed.  Final Clinical Impressions(s) / ED Diagnoses   Final diagnoses:  Ankle sprain, right, initial encounter    New Prescriptions New Prescriptions   No medications on file     Pauline Ausammy Colon Rueth, Cordelia Poche-C 07/30/16 1520    Donnetta HutchingBrian Cook, MD 07/31/16 [phone]

## 2016-07-30 NOTE — Discharge Instructions (Signed)
Elevate and apply ice packs on and off to your ankle. Usual crutches as needed for weightbearing. Call the orthopedic doctor listed to arrange a follow-up appointment in one week if not improving

## 2016-07-30 NOTE — ED Triage Notes (Signed)
Pt reports falling in her kitchen this afternoon. Injury to her R ankle.

## 2018-01-07 ENCOUNTER — Encounter (HOSPITAL_COMMUNITY): Payer: Self-pay | Admitting: *Deleted

## 2018-01-07 ENCOUNTER — Emergency Department (HOSPITAL_COMMUNITY)
Admission: EM | Admit: 2018-01-07 | Discharge: 2018-01-07 | Disposition: A | Discharge status: Home / Self Care | Payer: Self-pay | Attending: Emergency Medicine | Admitting: Emergency Medicine

## 2018-01-07 ENCOUNTER — Other Ambulatory Visit: Payer: Self-pay

## 2018-01-07 ENCOUNTER — Emergency Department (HOSPITAL_COMMUNITY): Payer: Self-pay

## 2018-01-07 DIAGNOSIS — R197 Diarrhea, unspecified: Secondary | ICD-10-CM | POA: Insufficient documentation

## 2018-01-07 DIAGNOSIS — R0989 Other specified symptoms and signs involving the circulatory and respiratory systems: Secondary | ICD-10-CM | POA: Insufficient documentation

## 2018-01-07 DIAGNOSIS — F17210 Nicotine dependence, cigarettes, uncomplicated: Secondary | ICD-10-CM | POA: Insufficient documentation

## 2018-01-07 DIAGNOSIS — J111 Influenza due to unidentified influenza virus with other respiratory manifestations: Secondary | ICD-10-CM

## 2018-01-07 DIAGNOSIS — J102 Influenza due to other identified influenza virus with gastrointestinal manifestations: Secondary | ICD-10-CM | POA: Insufficient documentation

## 2018-01-07 DIAGNOSIS — R112 Nausea with vomiting, unspecified: Secondary | ICD-10-CM | POA: Insufficient documentation

## 2018-01-07 HISTORY — DX: Other specified anxiety disorders: F41.8

## 2018-01-07 HISTORY — DX: Unspecified viral hepatitis C without hepatic coma: B19.20

## 2018-01-07 MED ORDER — ACETAMINOPHEN 500 MG PO TABS
1000.0000 mg | ORAL_TABLET | Freq: Once | ORAL | Status: AC
  Administered 2018-01-07: 1000 mg via ORAL
  Filled 2018-01-07: qty 2

## 2018-01-07 MED ORDER — SODIUM CHLORIDE 0.9 % IV BOLUS (SEPSIS)
1000.0000 mL | Freq: Once | INTRAVENOUS | Status: AC
  Administered 2018-01-07: 1000 mL via INTRAVENOUS

## 2018-01-07 MED ORDER — ONDANSETRON HCL 4 MG PO TABS: 4.0000 mg | ORAL_TABLET | Freq: Four times a day (QID) | ORAL | 0 refills | Status: DC

## 2018-01-07 MED ORDER — OSELTAMIVIR PHOSPHATE 75 MG PO CAPS
75.0000 mg | ORAL_CAPSULE | Freq: Once | ORAL | Status: AC
  Administered 2018-01-07: 75 mg via ORAL
  Filled 2018-01-07: qty 1

## 2018-01-07 MED ORDER — IBUPROFEN 800 MG PO TABS
800.0000 mg | ORAL_TABLET | Freq: Once | ORAL | Status: AC
  Administered 2018-01-07: 800 mg via ORAL
  Filled 2018-01-07: qty 1

## 2018-01-07 MED ORDER — OSELTAMIVIR PHOSPHATE 75 MG PO CAPS: 75.0000 mg | ORAL_CAPSULE | Freq: Two times a day (BID) | ORAL | 0 refills | Status: DC

## 2018-01-07 MED ORDER — DIPHENOXYLATE-ATROPINE 2.5-0.025 MG PO TABS: 2.0000 | ORAL_TABLET | Freq: Four times a day (QID) | ORAL | 0 refills | Status: DC | PRN

## 2018-01-07 MED ORDER — ONDANSETRON HCL 4 MG/2ML IJ SOLN
4.0000 mg | Freq: Once | INTRAMUSCULAR | Status: AC
  Administered 2018-01-07: 4 mg via INTRAVENOUS
  Filled 2018-01-07: qty 2

## 2018-01-07 NOTE — ED Notes (Signed)
Pt alert & oriented x4, stable gait. Patient given discharge instructions, paperwork & prescription(s). Patient  instructed to stop at the registration desk to finish any additional paperwork. Patient verbalized understanding. Pt left department w/ no further questions. 

## 2018-01-07 NOTE — ED Provider Notes (Signed)
[facility]GENCY [facility]MENT Provider Note   CSN: [id number] Arrival date & time: 01/07/18  0449     History   Chief Complaint Chief Complaint  Patient presents with  . flu like symptoms    HPI Alyssa Norris is a 47 y.o. female.  Patient reports that she has had cough and chest congestion for 2 weeks.  Overnight, however, she became acutely more ill.  She has developed headache, chills, generalized body aches with nausea, vomiting and diarrhea.  Her fianc was sick earlier this week with similar symptoms.      Past Medical History:  Diagnosis Date  . Anxiety associated with depression   . BV (bacterial vaginosis)   . Hepatitis C   . HSV (herpes simplex virus) infection     Patient Active Problem List   Diagnosis Date Noted  . Gonorrhea 07/08/2015  . 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 History of hyperthyroidism 03/31/2015  . History of thyroid nodule 03/31/2015  . Smoker 03/31/2015    Past Surgical History:  Procedure Laterality Date  . CHOLECYSTECTOMY    . TUMOR REMOVAL     abdomen @ age 47    OB History    Gravida Para Term Preterm AB Living   3 2 2   1      SAB TAB Ectopic Multiple Live Births     1             Home Medications    Prior to Admission medications   Medication Sig Start Date End Date Taking? Authorizing Provider  diphenoxylate-atropine (LOMOTIL) 2.5-0.025 MG tablet Take 2 tablets by mouth 4 (four) times daily as needed for diarrhea or loose stools. 01/07/18   Gilda CreasePollina, Manaia Samad J, MD  ondansetron (ZOFRAN) 4 MG tablet Take 1 tablet (4 mg total) by mouth every 6 (six) hours. 01/07/18   Gilda CreasePollina, Revia Nghiem J, MD  oseltamivir (TAMIFLU) 75 MG capsule Take 1 capsule (75 mg total) by mouth every 12 (twelve) hours. 01/07/18   Gilda CreasePollina, Debroh Sieloff J, MD    Family History Family History  Problem Relation Age of Onset  . Diabetes Mother   . Hypertension Mother   . Depression Mother   . Cancer Father        testicular  . Depression  Maternal Grandmother   . Heart disease Maternal Grandmother   . Hypertension Maternal Grandmother   . Cancer Paternal Grandfather     Social History Social History   Tobacco Use  . Smoking status: Current Some Day Smoker    Packs/day: 0.50  . Smokeless tobacco: Never Used  Substance Use Topics  . Alcohol use: Yes    Alcohol/week: 0.0 oz    Comment: occ  . Drug use: No     Allergies   Coconut oil; Erythromycin; and Penicillins   Review of Systems Review of Systems  HENT: Positive for congestion.   Respiratory: Positive for cough.   Gastrointestinal: Positive for diarrhea, nausea and vomiting.  Neurological: Positive for headaches.  All other systems reviewed and are negative.    Physical Exam Updated Vital Signs BP 107/76 (BP Location: Left Arm)   Pulse 92   Temp 98.6 F (37 C) (Oral)   Resp 18   Ht 5\' 6"  (1.676 m)   Wt 62.6 kg (138 lb)   SpO2 98%   BMI 22.27 kg/m   Physical Exam  Constitutional: She is oriented to person, place, and time. She appears well-developed and well-nourished. No distress.  HENT:  Head: Normocephalic and  atraumatic.  Right Ear: Hearing normal.  Left Ear: Hearing normal.  Nose: Nose normal.  Mouth/Throat: Oropharynx is clear and moist and mucous membranes are normal.  Eyes: Conjunctivae and EOM are normal. Pupils are equal, round, and reactive to light.  Neck: Normal range of motion. Neck supple.  Cardiovascular: Regular rhythm, S1 normal and S2 normal. Exam reveals no gallop and no friction rub.  No murmur heard. Pulmonary/Chest: Effort normal and breath sounds normal. No respiratory distress. She exhibits no tenderness.  Abdominal: Soft. Normal appearance and bowel sounds are normal. There is no hepatosplenomegaly. There is no tenderness. There is no rebound, no guarding, no tenderness at McBurney's point and negative Murphy's sign. No hernia.  Musculoskeletal: Normal range of motion.  Neurological: She is alert and oriented to  person, place, and time. She has normal strength. No cranial nerve deficit or sensory deficit. Coordination normal. GCS eye subscore is 4. GCS verbal subscore is 5. GCS motor subscore is 6.  Skin: Skin is warm, dry and intact. No rash noted. No cyanosis.  Psychiatric: She has a normal mood and affect. Her speech is normal and behavior is normal. Thought content normal.  Nursing note and vitals reviewed.    ED Treatments / Results  Labs (all labs ordered are listed, but only abnormal results are displayed) Labs Reviewed - No data to display  EKG  EKG Interpretation None       Radiology Dg Chest 2 View  Result Date: 01/07/2018 CLINICAL DATA:  47 y/o F; 2 weeks of chest congestion and cough. Woke up with vomiting and diarrhea. EXAM: CHEST  2 VIEW COMPARISON:  04/21/2006 chest radiograph FINDINGS: The heart size and mediastinal contours are within normal limits. Both lungs are clear. Left seventh posterolateral chronic rib fracture. No acute fracture identified. Right upper quadrant cholecystectomy clips. IMPRESSION: No acute pulmonary process identified. Electronically Signed   By: Mitzi Hansen M.D.   On: 01/07/2018 05:58    Procedures Procedures (including critical care time)  Medications Ordered in ED Medications  sodium chloride 0.9 % bolus 1,000 mL (0 mLs Intravenous Stopped 01/07/18 0620)  ondansetron (ZOFRAN) injection 4 mg (4 mg Intravenous Given 01/07/18 0525)  ibuprofen (ADVIL,MOTRIN) tablet 800 mg (800 mg Oral Given 01/07/18 0524)  acetaminophen (TYLENOL) tablet 1,000 mg (1,000 mg Oral Given 01/07/18 0524)  oseltamivir (TAMIFLU) capsule 75 mg (75 mg Oral Given 01/07/18 0527)     Initial Impression / Assessment and Plan / ED Course  I have reviewed the triage vital signs and the nursing notes.  Pertinent labs & imaging results that were available during my care of the patient were reviewed by me and considered in my medical decision making (see chart for details).       Presents to the emergency department with flulike illness.  She has had nausea, vomiting, diarrhea and upper respiratory infection symptoms.  Chest x-ray is clear, no pneumonia.  Patient treated with Zofran and IV fluids.  She does not appear to be significantly dehydrated.  No evidence of sepsis or bacterial infection.  There is a cyst significant outbreak of influenza in the community currently, will treat empirically for influenza.  Final Clinical Impressions(s) / ED Diagnoses   Final diagnoses:  Influenza    ED Discharge Orders        Ordered    oseltamivir (TAMIFLU) 75 MG capsule  Every 12 hours     01/07/18 0622    ondansetron (ZOFRAN) 4 MG tablet  Every 6 hours  01/07/18 0622    diphenoxylate-atropine (LOMOTIL) 2.5-0.025 MG tablet  4 times daily PRN     01/07/18 0622       Gilda Crease, MD 01/07/18 [phone]

## 2018-01-07 NOTE — ED Triage Notes (Addendum)
Pt reports having chest congestion x 2 weeks. Pt has been around her sick fiance. Pt states she woke up vomiting and having diarrhea around 10:30 pm. Reports chills, generalized body aches and numbness and tingling in her hands and feet.

## 2018-07-21 ENCOUNTER — Emergency Department (HOSPITAL_COMMUNITY)
Admission: EM | Admit: 2018-07-21 | Discharge: 2018-07-21 | Disposition: A | Discharge status: Home / Self Care | Payer: Medicaid Other | Attending: Emergency Medicine | Admitting: Emergency Medicine

## 2018-07-21 ENCOUNTER — Encounter (HOSPITAL_COMMUNITY): Payer: Self-pay

## 2018-07-21 DIAGNOSIS — F17210 Nicotine dependence, cigarettes, uncomplicated: Secondary | ICD-10-CM | POA: Insufficient documentation

## 2018-07-21 DIAGNOSIS — T40601A Poisoning by unspecified narcotics, accidental (unintentional), initial encounter: Secondary | ICD-10-CM

## 2018-07-21 DIAGNOSIS — Z79899 Other long term (current) drug therapy: Secondary | ICD-10-CM | POA: Insufficient documentation

## 2018-07-21 HISTORY — DX: Poisoning by heroin, accidental (unintentional), initial encounter: T40.1X1A

## 2018-07-21 HISTORY — DX: Other psychoactive substance abuse, uncomplicated: F19.10

## 2018-07-21 LAB — CBC WITH DIFFERENTIAL/PLATELET
Basophils Absolute: 0 10*3/uL (ref 0.0–0.1)
Basophils Relative: 0 %
Eosinophils Absolute: 0.1 10*3/uL (ref 0.0–0.7)
Eosinophils Relative: 1 %
HCT: 40.7 % (ref 36.0–46.0)
Hemoglobin: 13.4 g/dL (ref 12.0–15.0)
Lymphocytes Relative: 8 %
Lymphs Abs: 1.2 10*3/uL (ref 0.7–4.0)
MCH: 30.8 pg (ref 26.0–34.0)
MCHC: 32.9 g/dL (ref 30.0–36.0)
MCV: 93.6 fL (ref 78.0–100.0)
Monocytes Absolute: 1.1 10*3/uL — ABNORMAL HIGH (ref 0.1–1.0)
Monocytes Relative: 7 %
Neutro Abs: 13.3 10*3/uL — ABNORMAL HIGH (ref 1.7–7.7)
Neutrophils Relative %: 84 %
Platelets: 300 10*3/uL (ref 150–400)
RBC: 4.35 MIL/uL (ref 3.87–5.11)
RDW: 12.7 % (ref 11.5–15.5)
WBC: 15.7 10*3/uL — ABNORMAL HIGH (ref 4.0–10.5)

## 2018-07-21 LAB — COMPREHENSIVE METABOLIC PANEL
ALT: 7 U/L (ref 0–44)
AST: 13 U/L — ABNORMAL LOW (ref 15–41)
Albumin: 3.1 g/dL — ABNORMAL LOW (ref 3.5–5.0)
Alkaline Phosphatase: 62 U/L (ref 38–126)
Anion gap: 4 — ABNORMAL LOW (ref 5–15)
BUN: 7 mg/dL (ref 6–20)
CO2: 22 mmol/L (ref 22–32)
Calcium: 7.6 mg/dL — ABNORMAL LOW (ref 8.9–10.3)
Chloride: 115 mmol/L — ABNORMAL HIGH (ref 98–111)
Creatinine, Ser: 0.55 mg/dL (ref 0.44–1.00)
GFR calc Af Amer: >60 mL/min (ref >60)
GFR calc non Af Amer: >60 mL/min (ref >60)
Glucose, Bld: 97 mg/dL (ref 70–99)
Potassium: 3.8 mmol/L (ref 3.5–5.1)
Sodium: 141 mmol/L (ref 135–145)
Total Bilirubin: 0.4 mg/dL (ref 0.3–1.2)
Total Protein: 6.1 g/dL — ABNORMAL LOW (ref 6.5–8.1)

## 2018-07-21 MED ORDER — SODIUM CHLORIDE 0.9 % IV BOLUS
1000.0000 mL | Freq: Once | INTRAVENOUS | Status: AC
Start: 2018-07-21 — End: 2018-07-21
  Administered 2018-07-21: 1000 mL via INTRAVENOUS

## 2018-07-21 NOTE — ED Triage Notes (Signed)
Ems reports pt took a 10mg  valium and approx 1 hour later she shot up heroin.  Pt's boyfriend found pt unresponsive and cyanotic.  EMS says boyfriend gave 2 vials of 0.4mg  of narcan IM prior to arrival.  EMS reports pt lethargic but arousable.  Reports bp 80's systolic.  EMS administered bolus NSS.  CBG 140

## 2018-07-21 NOTE — ED Notes (Signed)
Pt stating she wants to leave. EDP notified.

## 2018-07-21 NOTE — ED Notes (Signed)
Received call from lab. States CBC was clotted and needs to be redrawn. New orders placed for CBC.

## 2018-07-21 NOTE — ED Notes (Signed)
Unable to obtain d/c vitals. Pt refused.

## 2018-07-21 NOTE — ED Provider Notes (Signed)
[facility] Provider [facility]   CSN: [id number] Arrival date & time: 07/21/18  1647     History   Chief Complaint Chief Complaint  Patient presents with  . Drug Overdose    HPI Alyssa Norris is a 47 y.o. female.  HPI   47 year old female with unintentional drug overdose.  She states that she took 10 mg of Valium earlier today and 1 hour later injected heroin.  She was found by her boyfriend unresponsive and cyanotic.  He gave her to IM injections of 0.4 mg of Narcan each.  Patient still remains very drowsy but arousable and answering questions.  She is not hypoxic but EMS reports blood pressures 80 systolic.  Glucose in the 100s.  She received 500 cc of normal saline prior to arrival.  She states that she was just trying to get high and was not intentionally trying to overdose.  She denies any other ingestions and does not have any regular prescribed medicines.  Past Medical History:  Diagnosis Date  . Anxiety associated with depression   . BV (bacterial vaginosis)   . Drug abuse (HCC)   . Hepatitis C   . HSV (herpes simplex virus) infection   . Overdose of heroin [facility](HCC)     Patient Active Problem List   Diagnosis Date Noted  . Gonorrhea 07/08/2015  . 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 History of hyperthyroidism 03/31/2015  . History of thyroid nodule 03/31/2015  . Smoker 03/31/2015    Past Surgical History:  Procedure Laterality Date  . CHOLECYSTECTOMY    . TUMOR REMOVAL     abdomen @ age 47     OB History    Gravida  3   Para  2   Term  2   Preterm      AB  1   Living        SAB      TAB  1   Ectopic      Multiple      Live Births               Home Medications    Prior to Admission medications   Medication Sig Start Date End Date Taking? Authorizing Provider  ibuprofen (ADVIL,MOTRIN) 200 MG tablet Take 200 mg by mouth every 6 (six) hours as needed for mild pain or moderate pain.   Yes [provider]    levonorgestrel (MIRENA) 20 MCG/24HR IUD 1 each by Intrauterine route once.   Yes [provider]  Multiple Vitamin (MULTIVITAMIN WITH MINERALS) TABS tablet Take 1 tablet by mouth daily.   Yes [provider]    Family History Family History  Problem Relation Age of Onset  . Diabetes Mother   . Hypertension Mother   . Depression Mother   . Cancer Father        testicular  . Depression Maternal Grandmother   . Heart disease Maternal Grandmother   . Hypertension Maternal Grandmother   . Cancer Paternal Grandfather     Social History Social History   Tobacco Use  . Smoking status: Current Some Day Smoker    Packs/day: 0.50  . Smokeless tobacco: Never Used  Substance Use Topics  . Alcohol use: Yes    Alcohol/week: 0.0 standard drinks    Comment: occ  . Drug use: No     Allergies   Coconut oil; Erythromycin; and Penicillins   Review of Systems Review of Systems  All systems reviewed and negative, other  than as noted in HPI.  Physical Exam Updated Vital Signs BP (!) 88/65   Pulse 67   Temp 97.9 F (36.6 C) (Oral)   Resp (!) 23   SpO2 100%   Physical Exam  Constitutional: She appears well-developed and well-nourished. No distress.  HENT:  Head: Normocephalic and atraumatic.  Eyes: Conjunctivae are normal. Right eye exhibits no discharge. Left eye exhibits no discharge.  Neck: Neck supple.  Cardiovascular: Normal rate, regular rhythm and normal heart sounds. Exam reveals no gallop and no friction rub.  No murmur heard. Pulmonary/Chest: Effort normal and breath sounds normal. No respiratory distress.  Abdominal: Soft. She exhibits no distension. There is no tenderness.  Musculoskeletal: She exhibits no edema or tenderness.  Neurological: She is alert.  Skin: Skin is warm and dry.  Psychiatric: She has a normal mood and affect. Her behavior is normal. Thought content normal.  Nursing note and vitals reviewed.    ED Treatments / Results   Labs (all labs ordered are listed, but only abnormal results are displayed) Labs Reviewed  COMPREHENSIVE METABOLIC PANEL - Abnormal; Notable for the following components:      Result Value   Chloride 115 (*)    Calcium 7.6 (*)    Total Protein 6.1 (*)    Albumin 3.1 (*)    AST 13 (*)    Anion gap 4 (*)    All other components within normal limits  CBC WITH DIFFERENTIAL/PLATELET - Abnormal; Notable for the following components:   WBC 15.7 (*)    Neutro Abs 13.3 (*)    Monocytes Absolute 1.1 (*)    All other components within normal limits  CBC WITH DIFFERENTIAL/PLATELET    EKG EKG Interpretation  Date/Time:  Saturday July 21 2018 16:53:01 EDT Ventricular Rate:  64 PR Interval:    QRS Duration: 95 QT Interval:  418 QTC Calculation: 432 R Axis:   87 Text Interpretation:  Sinus rhythm Low voltage, precordial leads Confirmed by Raeford Razor [phone]) on 07/21/2018 5:20:59 PM   Radiology No results found.  Procedures Procedures (including critical care time)  Medications Ordered in ED Medications  sodium chloride 0.9 % bolus 1,000 mL (1,000 mLs Intravenous New Bag/Given 07/21/18 1657)     Initial Impression / Assessment and Plan / ED Course  I have reviewed the triage vital signs and the nursing notes.  Pertinent labs & imaging results that were available during my care of the patient were reviewed by me and considered in my medical decision making (see chart for details).     47 year old female presenting after an unintentional drug overdose necessitating Narcan prehospital.  She still remains drowsy but she is answering questions appropriately maintaining her airway.  Oxygen saturations are good on room air.  She still remains somewhat hypotensive though.  Afebrile.  She reports that she was in her usual state of health earlier in the day.  We will continue to monitor.  We will give additional IV fluids.  Will check basic labs.  Pt wanting to leave AMA. She  understands my concerns for possible decompensation and respiratory failure/death/etc and my recommendation for longer monitoring. She has medical decision making capability.  Final Clinical Impressions(s) / ED Diagnoses   Final diagnoses:  Opiate overdose, accidental or unintentional, initial encounter [facility])    ED Discharge Orders    None       Raeford Razor, MD 07/22/18 1208

## 2019-02-11 ENCOUNTER — Other Ambulatory Visit: Payer: Self-pay | Admitting: Women's Health

## 2019-03-19 ENCOUNTER — Encounter: Payer: Self-pay | Admitting: Women's Health

## 2019-04-09 ENCOUNTER — Other Ambulatory Visit: Payer: Self-pay | Admitting: Women's Health

## 2019-04-22 ENCOUNTER — Other Ambulatory Visit: Payer: Self-pay | Admitting: Women's Health

## 2019-04-30 ENCOUNTER — Other Ambulatory Visit: Payer: Self-pay | Admitting: Adult Health

## 2020-06-12 ENCOUNTER — Other Ambulatory Visit: Payer: Medicaid Other | Admitting: Adult Health

## 2020-06-25 ENCOUNTER — Ambulatory Visit: Payer: Self-pay

## 2020-06-25 ENCOUNTER — Ambulatory Visit: Payer: Self-pay | Attending: Internal Medicine

## 2020-06-25 DIAGNOSIS — Z23 Encounter for immunization: Secondary | ICD-10-CM

## 2020-06-25 NOTE — Progress Notes (Signed)
   Covid-19 Vaccination Clinic  Name:  Alyssa Norris    MRN: [id number] DOB: Jan 17, 1971  06/25/2020  [name] was observed post Covid-19 immunization for 15 minutes without incident. She was provided with Vaccine Information Sheet and instruction to access the V-Safe system.   [name] was instructed to call 911 with any severe reactions post vaccine: Marland Kitchen Difficulty breathing  . Swelling of face and throat  . A fast heartbeat  . A bad rash all over body  . Dizziness and weakness   Immunizations Administered    Name Date Dose VIS Date Route   Pfizer COVID-19 Vaccine 06/25/2020 11:33 AM 0.3 mL 01/01/2019 Intramuscular   Manufacturer: [employer], [employer]   Lot: [id number]   NDC: [address]-6

## 2020-07-14 ENCOUNTER — Other Ambulatory Visit: Payer: Medicaid Other | Admitting: Adult Health

## 2020-07-16 ENCOUNTER — Ambulatory Visit: Payer: Medicaid Other | Attending: Internal Medicine

## 2020-07-16 DIAGNOSIS — Z23 Encounter for immunization: Secondary | ICD-10-CM

## 2020-07-16 NOTE — Progress Notes (Signed)
   Covid-19 Vaccination Clinic  Name:  NADINA FOMBY    MRN: [id number] DOB: 30-Dec-1970  07/16/2020  Ms. Cast was observed post Covid-19 immunization for 15 minutes without incident. She was provided with Vaccine Information Sheet and instruction to access the V-Safe system.   Ms. Harney was instructed to call 911 with any severe reactions post vaccine: Marland Kitchen Difficulty breathing  . Swelling of face and throat  . A fast heartbeat  . A bad rash all over body  . Dizziness and weakness   Immunizations Administered    Name Date Dose VIS Date Route   Pfizer COVID-19 Vaccine 07/16/2020 12:07 PM 0.3 mL 01/01/2019 Intramuscular   Manufacturer: [employer], [employer]   Lot: [id number]   NDC: [address]-6

## 2020-08-25 ENCOUNTER — Ambulatory Visit (INDEPENDENT_AMBULATORY_CARE_PROVIDER_SITE_OTHER): Payer: Medicaid Other | Admitting: Adult Health

## 2020-08-25 ENCOUNTER — Other Ambulatory Visit: Payer: Medicaid Other | Admitting: Adult Health

## 2020-08-25 ENCOUNTER — Other Ambulatory Visit (HOSPITAL_COMMUNITY)
Admission: RE | Admit: 2020-08-25 | Discharge: 2020-08-25 | Disposition: A | Discharge status: Home / Self Care | Payer: Medicaid Other | Source: Ambulatory Visit | Attending: Adult Health | Admitting: Adult Health

## 2020-08-25 ENCOUNTER — Encounter: Payer: Self-pay | Admitting: Adult Health

## 2020-08-25 VITALS — BP 105/71 | HR 72 | Ht 65.0 in | Wt 157.5 lb

## 2020-08-25 DIAGNOSIS — Z3009 Encounter for other general counseling and advice on contraception: Secondary | ICD-10-CM

## 2020-08-25 DIAGNOSIS — Z01411 Encounter for gynecological examination (general) (routine) with abnormal findings: Secondary | ICD-10-CM

## 2020-08-25 DIAGNOSIS — Z30432 Encounter for removal of intrauterine contraceptive device: Secondary | ICD-10-CM | POA: Insufficient documentation

## 2020-08-25 DIAGNOSIS — F32A Depression, unspecified: Secondary | ICD-10-CM | POA: Diagnosis not present

## 2020-08-25 DIAGNOSIS — Z113 Encounter for screening for infections with a predominantly sexual mode of transmission: Secondary | ICD-10-CM | POA: Insufficient documentation

## 2020-08-25 DIAGNOSIS — Z1211 Encounter for screening for malignant neoplasm of colon: Secondary | ICD-10-CM

## 2020-08-25 DIAGNOSIS — B1920 Unspecified viral hepatitis C without hepatic coma: Secondary | ICD-10-CM

## 2020-08-25 DIAGNOSIS — Z8619 Personal history of other infectious and parasitic diseases: Secondary | ICD-10-CM

## 2020-08-25 DIAGNOSIS — Z0000 Encounter for general adult medical examination without abnormal findings: Secondary | ICD-10-CM | POA: Insufficient documentation

## 2020-08-25 DIAGNOSIS — F419 Anxiety disorder, unspecified: Secondary | ICD-10-CM | POA: Diagnosis not present

## 2020-08-25 DIAGNOSIS — Z01419 Encounter for gynecological examination (general) (routine) without abnormal findings: Secondary | ICD-10-CM

## 2020-08-25 LAB — HEMOCCULT GUIAC POC 1CARD (OFFICE): Fecal Occult Blood, POC: NEGATIVE

## 2020-08-25 NOTE — Progress Notes (Signed)
Patient ID: Alyssa Norris, female   DOB: 02/18/1971, 49 y.o.   MRN: [id number] History of Present Illness: Alyssa Norris is a 49 year old white female, in 7 year relationship,G3P2012, in for well woman gyn exam and pap.She had mirena IUD she wants removed. She has Family Planning Medicaid but trying to get insurance.  No current PCP.    Current Medications, Allergies, Past Medical History, Past Surgical History, Family History and Social History were reviewed in [employer] record.     Review of Systems:  Patient denies any daily headaches, hearing loss, fatigue, blurred vision, shortness of breath, chest pain, abdominal pain, problems with bowel movements, urination, or intercourse. No joint pain. She complains of being teary, has anxiety and depression, no periods and occasional hot flashes, she does not sleep well, but she says she is going through some things. Has cyst right wrist to see orthopedist in [place], soon she says.  Physical Exam:BP 105/71 (BP Location: Left Arm, Patient Position: Sitting, Cuff Size: Normal)   Pulse 72   Ht 5\' 5"  (1.651 m)   Wt 157 lb 8 oz (71.4 kg)   BMI 26.21 kg/m consent signed for IUD removal.  General:  Well developed, well nourished, no acute distress Skin:  Warm and dry Neck:  Midline trachea, normal thyroid, good ROM, no lymphadenopathy Lungs; Clear to auscultation bilaterally Breast:  No dominant palpable mass, retraction, or nipple discharge Cardiovascular: Regular rate and rhythm Abdomen:  Soft, non tender, no hepatosplenomegaly Pelvic:  External genitalia is normal in appearance, no lesions.  The vagina is normal in appearance. Urethra has no lesions or masses. The cervix is bulbous,+IUD strings at os, pap with GC/CHL and high risk HPV genotyping performed.IUD strings and pt asked to cough and IUD easily removed.  Uterus is felt to be normal size, shape, and contour.  No adnexal masses or tenderness noted.Bladder is non  tender, no masses felt. Rectal: Good sphincter tone, no polyps, or hemorrhoids felt.  Hemoccult negative. Extremities/musculoskeletal:  No swelling or varicosities noted, no clubbing or cyanosis Psych:  No mood changes, alert and cooperative,seems sad AA is 0 Fall risk is high PHQ 9 score is 22 no SI, will refer to [facility]  [facility] - 08/25/20 1102      Pregnancy Intention Screening   Does the patient want to become pregnant in the next year? No    Does the patient's partner want to become pregnant in the next year? No    Would the patient like to discuss contraceptive options today? No      Contraception Wrap Up   Current Method IUD or IUS    End Method Female Condom    Contraception Counseling Provided No         Examination chaperoned by 08/27/20 LPN.    Impression and Plan: 1. Routine medical exam Pap sent   2. Encounter for gynecological examination with Papanicolaou smear of cervix Pap sent Physical in 1 year  Pap in 3 if normal Get mammogram  Advised once gets insurance to call for cologuard order   3. Encounter for screening fecal occult blood testing  4. Encounter for IUD removal Use condoms   5. Family planning  6. Screening examination for STD (sexually transmitted disease) Check HIV and RPR and hepatitis C antibody   7. Anxiety and depression Refer to [facility]   8. History of hepatitis C Check Hepatitis C antibody

## 2020-08-26 LAB — RPR: RPR Ser Ql: NONREACTIVE

## 2020-08-26 LAB — HEPATITIS C ANTIBODY: Hep C Virus Ab: >11 s/co ratio — ABNORMAL HIGH (ref 0.0–0.9)

## 2020-08-26 LAB — HIV ANTIBODY (ROUTINE TESTING W REFLEX): HIV Screen 4th Generation wRfx: NONREACTIVE

## 2020-08-28 LAB — CYTOLOGY - PAP
Chlamydia: NEGATIVE
Comment: NEGATIVE
Comment: NEGATIVE
Comment: NORMAL
Diagnosis: NEGATIVE
High risk HPV: NEGATIVE
Neisseria Gonorrhea: NEGATIVE

## 2021-02-24 ENCOUNTER — Other Ambulatory Visit: Payer: Self-pay | Admitting: Hematology and Oncology

## 2021-08-26 ENCOUNTER — Other Ambulatory Visit: Payer: Medicaid Other | Admitting: Adult Health

## 2021-09-07 ENCOUNTER — Other Ambulatory Visit: Payer: Medicaid Other | Admitting: Adult Health

## 2021-10-26 ENCOUNTER — Other Ambulatory Visit: Payer: Medicaid Other | Admitting: Adult Health

## 2021-11-02 ENCOUNTER — Emergency Department (HOSPITAL_COMMUNITY)
Admission: EM | Admit: 2021-11-02 | Discharge: 2021-11-03 | Disposition: A | Discharge status: Home / Self Care | Payer: Medicaid Other | Attending: Emergency Medicine | Admitting: Emergency Medicine

## 2021-11-02 ENCOUNTER — Other Ambulatory Visit: Payer: Self-pay

## 2021-11-02 DIAGNOSIS — F17210 Nicotine dependence, cigarettes, uncomplicated: Secondary | ICD-10-CM | POA: Insufficient documentation

## 2021-11-02 DIAGNOSIS — W268XXA Contact with other sharp object(s), not elsewhere classified, initial encounter: Secondary | ICD-10-CM | POA: Insufficient documentation

## 2021-11-02 DIAGNOSIS — S01312A Laceration without foreign body of left ear, initial encounter: Secondary | ICD-10-CM | POA: Insufficient documentation

## 2021-11-02 DIAGNOSIS — Z23 Encounter for immunization: Secondary | ICD-10-CM | POA: Insufficient documentation
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
## 2021-11-03 MED ORDER — TETANUS-DIPHTH-ACELL PERTUSSIS 5-2.5-18.5 LF-MCG/0.5 IM SUSY
0.5000 mL | PREFILLED_SYRINGE | Freq: Once | INTRAMUSCULAR | Status: AC
  Administered 2021-11-03: 0.5 mL via INTRAMUSCULAR
  Filled 2021-11-03: qty 0.5

## 2021-11-03 MED ORDER — LIDOCAINE-EPINEPHRINE-TETRACAINE (LET) TOPICAL GEL
3.0000 mL | Freq: Once | TOPICAL | Status: AC
  Administered 2021-11-03: 01:00:00 3 mL via TOPICAL
  Filled 2021-11-03: qty 3
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
## 2021-11-17 ENCOUNTER — Other Ambulatory Visit: Payer: Medicaid Other | Admitting: Adult Health

## 2021-12-22 ENCOUNTER — Other Ambulatory Visit: Payer: Medicaid Other | Admitting: Adult Health

## 2022-02-19 ENCOUNTER — Emergency Department (HOSPITAL_COMMUNITY): Payer: Self-pay

## 2022-02-19 ENCOUNTER — Encounter (HOSPITAL_COMMUNITY): Payer: Self-pay | Admitting: Emergency Medicine

## 2022-02-19 ENCOUNTER — Emergency Department (HOSPITAL_COMMUNITY)
Admission: EM | Admit: 2022-02-19 | Discharge: 2022-02-19 | Disposition: A | Discharge status: Home / Self Care | Payer: Self-pay | Attending: Emergency Medicine | Admitting: Emergency Medicine

## 2022-02-19 ENCOUNTER — Emergency Department (HOSPITAL_COMMUNITY)
Admission: EM | Admit: 2022-02-19 | Discharge: 2022-02-19 | Disposition: A | Discharge status: Home / Self Care | Payer: Medicaid Other | Attending: Emergency Medicine | Admitting: Emergency Medicine

## 2022-02-19 ENCOUNTER — Other Ambulatory Visit: Payer: Self-pay

## 2022-02-19 ENCOUNTER — Encounter (HOSPITAL_COMMUNITY): Payer: Self-pay

## 2022-02-19 DIAGNOSIS — R0789 Other chest pain: Secondary | ICD-10-CM | POA: Insufficient documentation

## 2022-02-19 DIAGNOSIS — F1193 Opioid use, unspecified with withdrawal: Secondary | ICD-10-CM

## 2022-02-19 DIAGNOSIS — R079 Chest pain, unspecified: Secondary | ICD-10-CM | POA: Insufficient documentation

## 2022-02-19 DIAGNOSIS — F1120 Opioid dependence, uncomplicated: Secondary | ICD-10-CM | POA: Insufficient documentation

## 2022-02-19 DIAGNOSIS — F1113 Opioid abuse with withdrawal: Secondary | ICD-10-CM | POA: Insufficient documentation

## 2022-02-19 LAB — BASIC METABOLIC PANEL
Anion gap: 8 (ref 5–15)
BUN: 10 mg/dL (ref 6–20)
CO2: 23 mmol/L (ref 22–32)
Calcium: 9 mg/dL (ref 8.9–10.3)
Chloride: 105 mmol/L (ref 98–111)
Creatinine, Ser: 0.54 mg/dL (ref 0.44–1.00)
GFR, Estimated: >60 mL/min (ref >60)
Glucose, Bld: 114 mg/dL — ABNORMAL HIGH (ref 70–99)
Potassium: 3.6 mmol/L (ref 3.5–5.1)
Sodium: 136 mmol/L (ref 135–145)

## 2022-02-19 LAB — RAPID URINE DRUG SCREEN, HOSP PERFORMED
Amphetamines: NOT DETECTED
Barbiturates: NOT DETECTED
Benzodiazepines: POSITIVE — AB
Cocaine: NOT DETECTED
Opiates: POSITIVE — AB
Tetrahydrocannabinol: NOT DETECTED

## 2022-02-19 LAB — URINALYSIS, ROUTINE W REFLEX MICROSCOPIC
Bilirubin Urine: NEGATIVE
Glucose, UA: NEGATIVE mg/dL
Hgb urine dipstick: NEGATIVE
Ketones, ur: NEGATIVE mg/dL
Leukocytes,Ua: NEGATIVE
Nitrite: NEGATIVE
Protein, ur: NEGATIVE mg/dL
Specific Gravity, Urine: 1.008 (ref 1.005–1.030)
pH: 8 (ref 5.0–8.0)

## 2022-02-19 LAB — CBC
HCT: 41.3 % (ref 36.0–46.0)
Hemoglobin: 13.8 g/dL (ref 12.0–15.0)
MCH: 28.9 pg (ref 26.0–34.0)
MCHC: 33.4 g/dL (ref 30.0–36.0)
MCV: 86.4 fL (ref 80.0–100.0)
Platelets: 377 10*3/uL (ref 150–400)
RBC: 4.78 MIL/uL (ref 3.87–5.11)
RDW: 12.2 % (ref 11.5–15.5)
WBC: 9.1 10*3/uL (ref 4.0–10.5)
nRBC: 0 % (ref 0.0–0.2)

## 2022-02-19 LAB — HEPATIC FUNCTION PANEL
ALT: 13 U/L (ref 0–44)
AST: 18 U/L (ref 15–41)
Albumin: 3.8 g/dL (ref 3.5–5.0)
Alkaline Phosphatase: 103 U/L (ref 38–126)
Bilirubin, Direct: 0.1 mg/dL (ref 0.0–0.2)
Indirect Bilirubin: 0.4 mg/dL (ref 0.3–0.9)
Total Bilirubin: 0.5 mg/dL (ref 0.3–1.2)
Total Protein: 7.9 g/dL (ref 6.5–8.1)

## 2022-02-19 LAB — TROPONIN I (HIGH SENSITIVITY): Troponin I (High Sensitivity): <2 ng/L (ref <18)

## 2022-02-19 LAB — LIPASE, BLOOD: Lipase: 22 U/L (ref 11–51)

## 2022-02-19 LAB — D-DIMER, QUANTITATIVE: D-Dimer, Quant: 1.29 ug/mL-FEU — ABNORMAL HIGH (ref 0.00–0.50)

## 2022-02-19 MED ORDER — ONDANSETRON HCL 4 MG/2ML IJ SOLN
4.0000 mg | Freq: Once | INTRAMUSCULAR | Status: AC
  Administered 2022-02-19: 4 mg via INTRAVENOUS
  Filled 2022-02-19: qty 2

## 2022-02-19 MED ORDER — ONDANSETRON 8 MG PO TBDP
8.0000 mg | ORAL_TABLET | Freq: Once | ORAL | Status: AC
  Administered 2022-02-19: 8 mg via ORAL
  Filled 2022-02-19: qty 1

## 2022-02-19 MED ORDER — HYDROXYZINE HCL 25 MG PO TABS: 25.0000 mg | ORAL_TABLET | Freq: Four times a day (QID) | ORAL | 0 refills | Status: DC | PRN

## 2022-02-19 MED ORDER — LOPERAMIDE HCL 2 MG PO CAPS: 2.0000 mg | ORAL_CAPSULE | Freq: Four times a day (QID) | ORAL | 0 refills | Status: DC | PRN

## 2022-02-19 MED ORDER — CLONIDINE HCL 0.1 MG PO TABS
0.1000 mg | ORAL_TABLET | Freq: Once | ORAL | Status: AC
  Administered 2022-02-19: 0.1 mg via ORAL
  Filled 2022-02-19: qty 1

## 2022-02-19 MED ORDER — SODIUM CHLORIDE 0.9 % IV BOLUS
1000.0000 mL | Freq: Once | INTRAVENOUS | Status: AC
  Administered 2022-02-19: 1000 mL via INTRAVENOUS

## 2022-02-19 MED ORDER — IOHEXOL 350 MG/ML SOLN
75.0000 mL | Freq: Once | INTRAVENOUS | Status: AC | PRN
Start: 2022-02-19 — End: 2022-02-19
  Administered 2022-02-19: 75 mL via INTRAVENOUS

## 2022-02-19 MED ORDER — LIDOCAINE VISCOUS HCL 2 % MT SOLN
15.0000 mL | Freq: Once | OROMUCOSAL | Status: AC
  Administered 2022-02-19: 15 mL via ORAL
  Filled 2022-02-19: qty 15

## 2022-02-19 MED ORDER — LOPERAMIDE HCL 2 MG PO CAPS
4.0000 mg | ORAL_CAPSULE | Freq: Once | ORAL | Status: AC
  Administered 2022-02-19: 4 mg via ORAL
  Filled 2022-02-19: qty 2

## 2022-02-19 MED ORDER — ONDANSETRON 4 MG PO TBDP: ORAL_TABLET | ORAL | 0 refills | Status: DC

## 2022-02-19 MED ORDER — ALUM & MAG HYDROXIDE-SIMETH 400-400-40 MG/5ML PO SUSP: 15.0000 mL | Freq: Four times a day (QID) | ORAL | 0 refills | Status: DC | PRN

## 2022-02-19 MED ORDER — KETOROLAC TROMETHAMINE 30 MG/ML IJ SOLN
30.0000 mg | Freq: Once | INTRAMUSCULAR | Status: AC
  Administered 2022-02-19: 30 mg via INTRAVENOUS
  Filled 2022-02-19: qty 1

## 2022-02-19 MED ORDER — HYDROXYZINE HCL 25 MG PO TABS
50.0000 mg | ORAL_TABLET | Freq: Once | ORAL | Status: AC
  Administered 2022-02-19: 50 mg via ORAL
  Filled 2022-02-19: qty 2

## 2022-02-19 MED ORDER — ALUM & MAG HYDROXIDE-SIMETH 200-200-20 MG/5ML PO SUSP
30.0000 mL | Freq: Once | ORAL | Status: AC
  Administered 2022-02-19: 30 mL via ORAL
  Filled 2022-02-19: qty 30

## 2022-02-19 NOTE — ED Provider Notes (Signed)
?[facility] EMERGENCY DEPARTMENT ?Provider Note ? ? ?CSN: [id number] ?Arrival date & time: 02/19/22  0830 ? ?  ? ?History ? ?Chief Complaint  ?Patient presents with  ? Chest Pain  ? ? ?Alyssa Norris is a 51 y.o. female. ? ?Pt is a 51 yo female with a pmhx significant for anxiety and opiate abuse.  Pt said she last used fentanyl 24 hrs ago and is withdrawing.  She was here earlier and was seen and d/c.  She was waiting in the lobby for a ride when her sx worsened.  She has cp and pain all over.  She has n/v and feels anxious. ? ? ?  ? ?Home Medications ?Prior to Admission medications   ?Medication Sig Start Date End Date Taking? Authorizing Provider  ?hydrOXYzine (ATARAX) 25 MG tablet Take 1 tablet (25 mg total) by mouth every 6 (six) hours as needed for anxiety. 02/19/22  Yes Jacalyn LefevreHaviland, Tomika Eckles, MD  ?alum & mag hydroxide-simeth (MAALOX PLUS) 400-400-40 MG/5ML suspension Take 15 mLs by mouth every 6 (six) hours as needed for indigestion. 02/19/22   Mesner, Barbara CowerJason, MD  ?ibuprofen (ADVIL,MOTRIN) 200 MG tablet Take 200 mg by mouth every 6 (six) hours as needed for mild pain or moderate pain.    [provider]  ?loperamide (IMODIUM) 2 MG capsule Take 1 capsule (2 mg total) by mouth 4 (four) times daily as needed for diarrhea or loose stools. 02/19/22   Mesner, Barbara CowerJason, MD  ?Multiple Vitamin (MULTIVITAMIN WITH MINERALS) TABS tablet Take 1 tablet by mouth daily.    [provider]  ?ondansetron (ZOFRAN-ODT) 4 MG disintegrating tablet 4mg  ODT q4 hours prn nausea/vomit 02/19/22   Mesner, Barbara CowerJason, MD  ?   ? ?Allergies    ?Coconut oil, Erythromycin, and Penicillins   ? ?Review of Systems   ?Review of Systems  ?Constitutional:  Positive for chills.  ?Cardiovascular:  Positive for chest pain.  ?Gastrointestinal:  Positive for nausea and vomiting.  ?All other systems reviewed and are negative. ? ?Physical Exam ?Updated Vital Signs ?BP 110/69   Pulse 67   Temp 98.3 ?F (36.8 ?C) (Oral)   Resp 14   Ht 5\' 5"  (1.651 m)    Wt 68 kg   SpO2 99%   BMI 24.95 kg/m?  ?Physical Exam ?Vitals and nursing note reviewed.  ?Constitutional:   ?   Appearance: She is well-developed.  ?HENT:  ?   Head: Normocephalic and atraumatic.  ?Eyes:  ?   Extraocular Movements: Extraocular movements intact.  ?   Pupils: Pupils are equal, round, and reactive to light.  ?Cardiovascular:  ?   Rate and Rhythm: Normal rate and regular rhythm.  ?   Heart sounds: Normal heart sounds.  ?Pulmonary:  ?   Effort: Pulmonary effort is normal.  ?   Breath sounds: Normal breath sounds.  ?Abdominal:  ?   General: Bowel sounds are normal.  ?   Palpations: Abdomen is soft.  ?Musculoskeletal:     ?   General: Normal range of motion.  ?   Cervical back: Normal range of motion and neck supple.  ?Skin: ?   General: Skin is warm.  ?   Capillary Refill: Capillary refill takes less than 2 seconds.  ?Neurological:  ?   General: No focal deficit present.  ?   Mental Status: She is alert and oriented to person, place, and time.  ?Psychiatric:     ?   Mood and Affect: Mood is anxious.  ? ? ?ED  Results / Procedures / Treatments   ?Labs ?(all labs ordered are listed, but only abnormal results are displayed) ?Labs Reviewed  ?BASIC METABOLIC PANEL - Abnormal; Notable for the following components:  ?    Result Value  ? Glucose, Bld 114 (*)   ? All other components within normal limits  ?URINALYSIS, ROUTINE W REFLEX MICROSCOPIC - Abnormal; Notable for the following components:  ? APPearance HAZY (*)   ? All other components within normal limits  ?D-DIMER, QUANTITATIVE - Abnormal; Notable for the following components:  ? D-Dimer, Quant 1.29 (*)   ? All other components within normal limits  ?RAPID URINE DRUG SCREEN, HOSP PERFORMED - Abnormal; Notable for the following components:  ? Opiates POSITIVE (*)   ? Benzodiazepines POSITIVE (*)   ? All other components within normal limits  ?CBC  ?LIPASE, BLOOD  ?HEPATIC FUNCTION PANEL  ?TROPONIN I (HIGH SENSITIVITY)  ?TROPONIN I (HIGH SENSITIVITY)   ? ? ?EKG ?EKG Interpretation ? ?Date/Time:  Saturday February 19 2022 08:37:28 EDT ?Ventricular Rate:  81 ?PR Interval:  149 ?QRS Duration: 115 ?QT Interval:  379 ?QTC Calculation: 440 ?R Axis:   99 ?Text Interpretation: Sinus rhythm Nonspecific intraventricular conduction delay No significant change since last tracing Confirmed by Jacalyn Lefevre [phone]) on 02/19/2022 8:53:12 AM ? ?Radiology ?CT Angio Chest PE W and/or Wo Contrast ? ?Result Date: 02/19/2022 ?CLINICAL DATA:  Chest pain EXAM: CT ANGIOGRAPHY CHEST WITH CONTRAST TECHNIQUE: Multidetector CT imaging of the chest was performed using the standard protocol during bolus administration of intravenous contrast. Multiplanar CT image reconstructions and MIPs were obtained to evaluate the vascular anatomy. RADIATION DOSE REDUCTION: This exam was performed according to the departmental dose-optimization program which includes automated exposure control, adjustment of the mA and/or kV according to patient size and/or use of iterative reconstruction technique. CONTRAST:  82mL OMNIPAQUE IOHEXOL 350 MG/ML SOLN COMPARISON:  Chest radiograph done earlier today FINDINGS: Cardiovascular: There is homogeneous enhancement in thoracic aorta. There are no intraluminal filling defects in the pulmonary artery branches. Mediastinum/Nodes: No significant lymphadenopathy seen. Lungs/Pleura: There is no focal pulmonary consolidation. There is no pleural effusion or pneumothorax. Upper Abdomen: Surgical clips are seen in gallbladder fossa. Musculoskeletal: There is slight decrease in height of upper endplate of body of T10 vertebra, possibly mild old compression. Schmorl's node is seen in the upper endplate of body of T10 vertebra. Review of the MIP images confirms the above findings. IMPRESSION: There is no evidence of pulmonary artery embolism. There is no evidence of thoracic aortic dissection. There is no focal pulmonary consolidation. Slight decrease in height of upper endplate of  body of T10 vertebra may be residual from previous injury. Electronically Signed   By: Ernie Avena M.D.   On: 02/19/2022 10:28  ? ?DG Chest Port 1 View ? ?Result Date: 02/19/2022 ?CLINICAL DATA:  Chest pain worsening today. EXAM: PORTABLE CHEST 1 VIEW COMPARISON:  January 07, 2018 FINDINGS: The heart size and mediastinal contours are within normal limits. Both lungs are clear. The visualized skeletal structures are unremarkable. IMPRESSION: No active disease. Electronically Signed   By: Sherian Rein M.D.   On: 02/19/2022 09:42   ? ?Procedures ?Procedures  ? ? ?Medications Ordered in ED ?Medications  ?sodium chloride 0.9 % bolus 1,000 mL (0 mLs Intravenous Stopped 02/19/22 1001)  ?ondansetron [facility]) injection 4 mg (4 mg Intravenous Given 02/19/22 0907)  ?hydrOXYzine (ATARAX) tablet 50 mg (50 mg Oral Given 02/19/22 0907)  ?ketorolac (TORADOL) 30 MG/ML injection 30 mg (30 mg  Intravenous Given 02/19/22 0907)  ?iohexol (OMNIPAQUE) 350 MG/ML injection 75 mL (75 mLs Intravenous Contrast Given 02/19/22 1006)  ? ? ?ED Course/ Medical Decision Making/ A&P ?  ?                        ?Medical Decision Making ?Amount and/or Complexity of Data Reviewed ?Labs: ordered. ?Radiology: ordered. ? ?Risk ?Prescription drug management. ? ? ?This patient presents to the ED for concern of cp, this involves an extensive number of treatment options, and is a complaint that carries with it a high risk of complications and morbidity.  The differential diagnosis includes cardiac, pulmonary, gi ? ? ?Co morbidities that complicate the patient evaluation ? ?anxiety and opiate abuse ? ? ?Additional history obtained: ? ?Additional history obtained from epic chart review ? ? ?Lab Tests: ? ?I Ordered, and personally interpreted labs.  The pertinent results include:  cbc is nl, bmp nl, uds + opiates and benzos; trop neg; ua neg; ddimer is elevated ? ? ?Imaging Studies ordered: ? ?I ordered imaging studies including cxr and ct chest ?I independently  visualized and interpreted imaging which showed CXR: ?  ?IMPRESSION:  ?No active disease.  ?CT chest: ?  ?IMPRESSION:  ?There is no evidence of pulmonary artery embolism. There is no  ?evidence of thoracic aorti

## 2022-02-19 NOTE — ED Triage Notes (Signed)
Patient reports worsening chest pain while waiting for ride in lobby. States that she had pain while at first visit this am but rates it as intolerable at this time. Reports associated SHOB. Patient actively withdrawing from Fentanyl.  ?

## 2022-02-19 NOTE — ED Provider Notes (Signed)
?[place] [facility] ?Provider Note ? ? ?CSN: [id number] ?Arrival date & time: 02/19/22  0549 ? ?  ? ?History ? ?Chief Complaint  ?Patient presents with  ? Withdrawal  ? ? ?Alyssa Norris is a 51 y.o. female. ? ?51 yo F is here seekign assistance for withdrawal symptoms from fentanyl. Last used 24 hours go. ? States she 'feels like she is on ice which she has had before but does not currently use but it feels like, could have been given to me by someone who uses it'. Feels unwell. Has diarrhea but might be constipated. Has nausea and anxiety. No fever. No urinary symptoms.  ? ? ? ?  ? ?Home Medications ?Prior to Admission medications   ?Medication Sig Start Date End Date Taking? Authorizing Provider  ?alum & mag hydroxide-simeth (MAALOX PLUS) 400-400-40 MG/5ML suspension Take 15 mLs by mouth every 6 (six) hours as needed for indigestion. 02/19/22  Yes Farley Crooker, Corene Cornea, MD  ?loperamide (IMODIUM) 2 MG capsule Take 1 capsule (2 mg total) by mouth 4 (four) times daily as needed for diarrhea or loose stools. 02/19/22  Yes Sheilyn Boehlke, Corene Cornea, MD  ?ondansetron (ZOFRAN-ODT) 4 MG disintegrating tablet 4mg  ODT q4 hours prn nausea/vomit 02/19/22  Yes Granite Godman, Corene Cornea, MD  ?ibuprofen (ADVIL,MOTRIN) 200 MG tablet Take 200 mg by mouth every 6 (six) hours as needed for mild pain or moderate pain.    [provider]  ?Multiple Vitamin (MULTIVITAMIN WITH MINERALS) TABS tablet Take 1 tablet by mouth daily.    [provider]  ?   ? ?Allergies    ?Coconut oil, Erythromycin, and Penicillins   ? ?Review of Systems   ?Review of Systems ? ?Physical Exam ?Updated Vital Signs ?BP 111/77   Pulse 77   Temp 97.7 ?F (36.5 ?C) (Oral)   Resp 17   Ht 5\' 5"  (1.651 m)   Wt 68 kg   SpO2 97%   BMI 24.95 kg/m?  ?Physical Exam ?Vitals and nursing note reviewed.  ?Constitutional:   ?   Appearance: She is well-developed.  ?HENT:  ?   Head: Normocephalic and atraumatic.  ?   Mouth/Throat:  ?   Mouth: Mucous membranes are moist.  ?    Pharynx: Oropharynx is clear.  ?Eyes:  ?   Pupils: Pupils are equal, round, and reactive to light.  ?Cardiovascular:  ?   Rate and Rhythm: Normal rate and regular rhythm.  ?Pulmonary:  ?   Effort: Pulmonary effort is normal. No respiratory distress.  ?   Breath sounds: No stridor.  ?Abdominal:  ?   General: Abdomen is flat. There is no distension.  ?Musculoskeletal:     ?   General: No swelling or tenderness. Normal range of motion.  ?   Cervical back: Normal range of motion.  ?Skin: ?   General: Skin is warm and dry.  ?Neurological:  ?   General: No focal deficit present.  ?   Mental Status: She is alert.  ? ? ?ED Results / Procedures / Treatments   ?Labs ?(all labs ordered are listed, but only abnormal results are displayed) ?Labs Reviewed - No data to display ? ?EKG ?EKG Interpretation ? ?Date/Time:  Saturday February 19 2022 06:53:06 EDT ?Ventricular Rate:  71 ?PR Interval:  142 ?QRS Duration: 91 ?QT Interval:  381 ?QTC Calculation: 414 ?R Axis:   91 ?Text Interpretation: Sinus rhythm Borderline right axis deviation Confirmed by Merrily Pew [phone]) on 02/19/2022 7:05:31 AM ? ?Radiology ?No results found. ? ?Procedures ?  Procedures  ? ? ?Medications Ordered in ED ?Medications  ?cloNIDine (CATAPRES) tablet 0.1 mg (0.1 mg Oral Given 02/19/22 0606)  ?ondansetron (ZOFRAN-ODT) disintegrating tablet 8 mg (8 mg Oral Given 02/19/22 0606)  ?loperamide (IMODIUM) capsule 4 mg (4 mg Oral Given 02/19/22 0606)  ?alum & mag hydroxide-simeth (MAALOX/MYLANTA) 200-200-20 MG/5ML suspension 30 mL (30 mLs Oral Given 02/19/22 0704)  ?  And  ?lidocaine (XYLOCAINE) 2 % viscous mouth solution 15 mL (15 mLs Oral Given 02/19/22 0704)  ? ? ?ED Course/ Medical Decision Making/ A&P ?  ?                        ?Medical Decision Making ?Amount and/or Complexity of Data Reviewed ?ECG/medicine tests: ordered. ? ?Risk ?OTC drugs. ?Prescription drug management. ? ? ?Appears well. I offered medications to help with symptoms. Then she requests for me to keep  her in ED because she has nowhere else to stay. Then asks if I can help her find somewhere as she has nowhere to go.  ?Will treat symptoms and reeval for stability, but likely discharge.  ?On reevaluation, patietn was sleeping quietly in bed. When I opened the door she stood up and stated she had severe chest pain. I will check an ECG but as this was not her initial complaint and only seemed to happen when awoken, I doubt ACS and get a sense that patient may be malingering. If ECG is reassuring, patient can be discharged.  ?Patient told nursing that since she had chest pain we were required to keep her.  ?ECG does not show STEMI or evidence of cardiac ischemia. Says bordrline RAD but I don't see any changes from 2019 to suggest PE, pericarditis, myocarditis or pulmonary hypertension. This is likely her baseline.  ?VSS. Rx for zofran/loperamide.  ? ?Final Clinical Impression(s) / ED Diagnoses ?Final diagnoses:  ?Chest pain, unspecified type  ? ? ?Rx / DC Orders ?ED Discharge Orders   ? ?      Ordered  ?  alum & mag hydroxide-simeth (MAALOX PLUS) 400-400-40 MG/5ML suspension  Every 6 hours PRN       ? 02/19/22 0657  ?  ondansetron (ZOFRAN-ODT) 4 MG disintegrating tablet       ? 02/19/22 0657  ?  loperamide (IMODIUM) 2 MG capsule  4 times daily PRN       ? 02/19/22 0657  ? ?  ?  ? ?  ? ? ?  ?Merrily Pew, MD ?02/19/22 [phone] ? ?

## 2022-02-19 NOTE — ED Triage Notes (Signed)
Pt c/o withdrawal symptoms that started today. Pt c/o N/V/D and body aches. Pt states she has been using fentanyl for the past 3 years. States she wants resources for inpt detox.  ?

## 2022-02-19 NOTE — ED Notes (Signed)
Pt provided with list of inpt and outpt drug treatment services. ?

## 2022-02-25 ENCOUNTER — Encounter: Payer: Self-pay | Admitting: Advanced Practice Midwife

## 2022-02-25 ENCOUNTER — Ambulatory Visit: Payer: Medicaid Other | Admitting: Advanced Practice Midwife

## 2022-02-25 VITALS — BP 117/71 | HR 72 | Temp 97.6°F | Ht 66.0 in | Wt 139.4 lb

## 2022-02-25 DIAGNOSIS — T7411XS Adult physical abuse, confirmed, sequela: Secondary | ICD-10-CM

## 2022-02-25 DIAGNOSIS — F1990 Other psychoactive substance use, unspecified, uncomplicated: Secondary | ICD-10-CM | POA: Insufficient documentation

## 2022-02-25 DIAGNOSIS — Z113 Encounter for screening for infections with a predominantly sexual mode of transmission: Secondary | ICD-10-CM

## 2022-02-25 DIAGNOSIS — T7411XA Adult physical abuse, confirmed, initial encounter: Secondary | ICD-10-CM | POA: Insufficient documentation

## 2022-02-25 DIAGNOSIS — Z62810 Personal history of physical and sexual abuse in childhood: Secondary | ICD-10-CM

## 2022-02-25 DIAGNOSIS — Z720 Tobacco use: Secondary | ICD-10-CM

## 2022-02-25 LAB — WET PREP FOR TRICH, YEAST, CLUE
Trichomonas Exam: NEGATIVE
Yeast Exam: NEGATIVE

## 2022-02-25 MED ORDER — FLUCONAZOLE 100 MG PO TABS: 100.0000 mg | ORAL_TABLET | Freq: Once | ORAL | Status: DC

## 2022-02-25 MED ORDER — FLUCONAZOLE 150 MG PO TABS: 150.0000 mg | ORAL_TABLET | Freq: Once | ORAL | 0 refills | Status: AC
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
## 2022-03-01 MED ORDER — CLOTRIMAZOLE 1 % VA CREA: 1.0000 | TOPICAL_CREAM | Freq: Every day | VAGINAL | 0 refills | Status: DC

## 2022-03-01 MED ORDER — CLOTRIMAZOLE 1 % VA CREA: 1.0000 | TOPICAL_CREAM | Freq: Every day | VAGINAL | 0 refills | Status: AC

## 2022-03-01 NOTE — Addendum Note (Signed)
Addended by: Delynn Flavin A on: 03/01/2022 12:22 PM ? ? Modules accepted: Orders ? ?

## 2023-06-13 IMAGING — CT CT ANGIO CHEST
2 of 7 series · 18 of 46 positions shown · IV contrast (Omnipaque or Isovue)
Comparison: Chest radiograph done earlier today

CLINICAL DATA: Chest pain

EXAM:
CT ANGIOGRAPHY CHEST WITH CONTRAST
TECHNIQUE: Multidetector CT imaging of the chest was performed using the
standard protocol during bolus administration of intravenous
contrast. Multiplanar CT image reconstructions and MIPs were
obtained to evaluate the vascular anatomy.

[Series 8: pe axial thins · axial · 0.78mm/px · z∈[+1682,+1930]mm · 15 of 350 slices shown]
[im 20/350  lung]
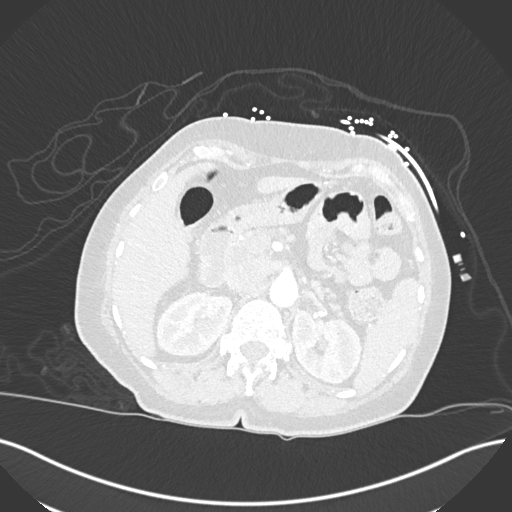
[im 39/350  soft-tissue]
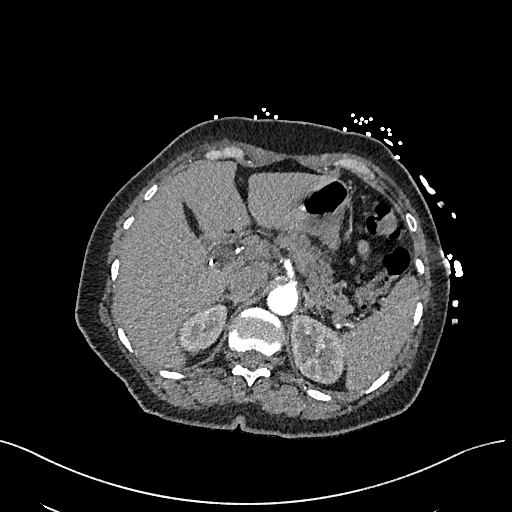
[im 59/350  lung]
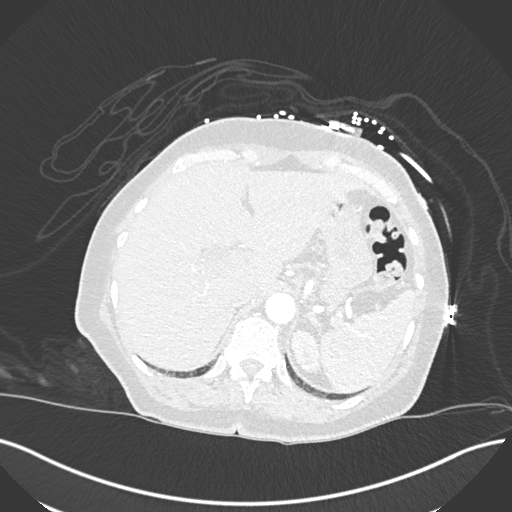
[im 78/350  soft-tissue]
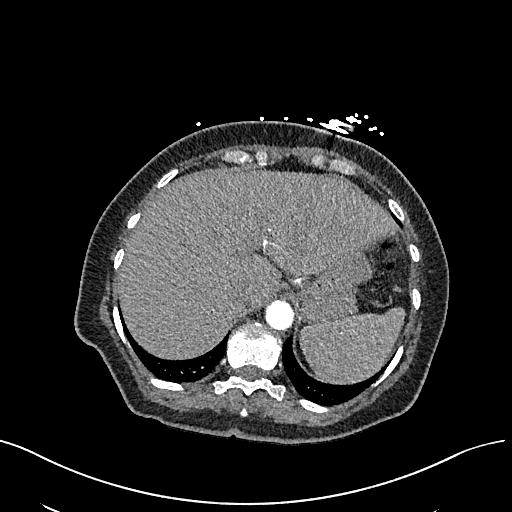
[im 117/350  lung]
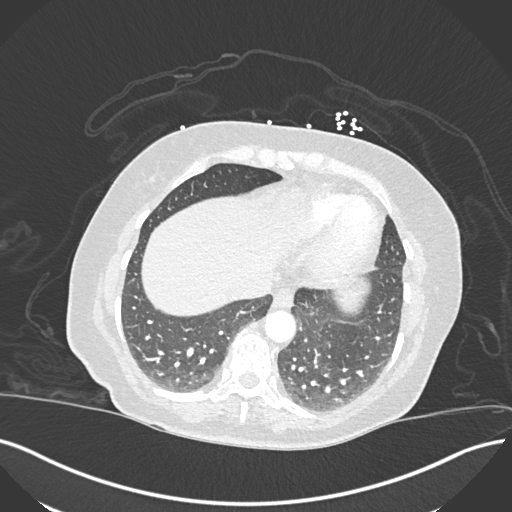
[im 136/350  soft-tissue]
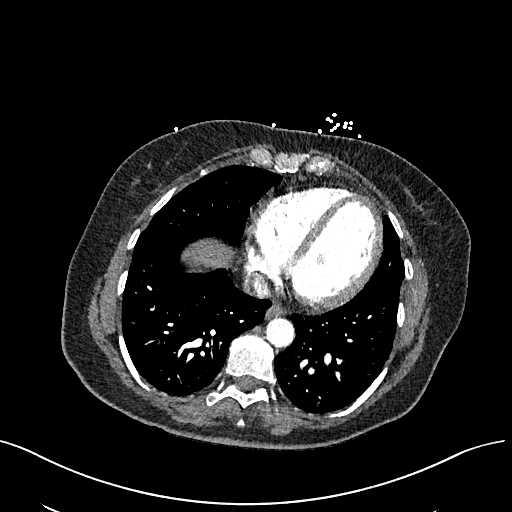
[im 156/350  lung]
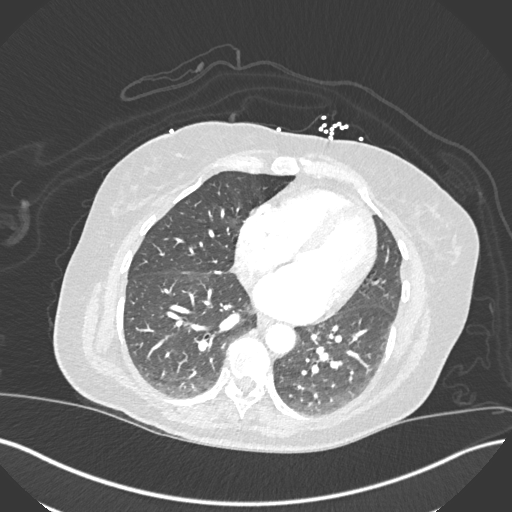
[im 175/350  soft-tissue]
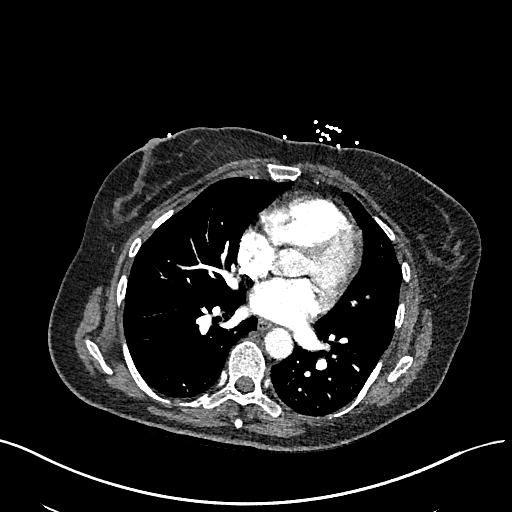
[im 194/350  lung]
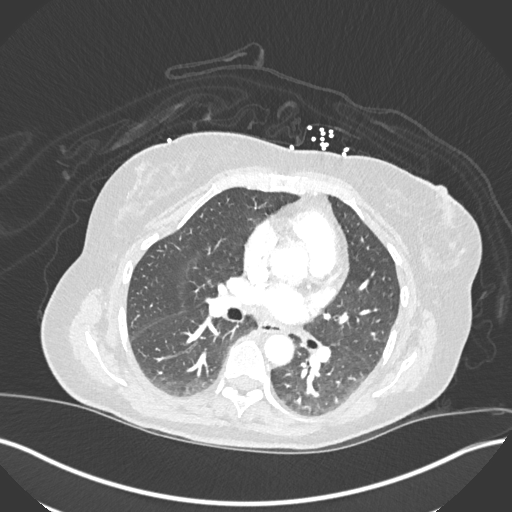
[im 214/350  soft-tissue]
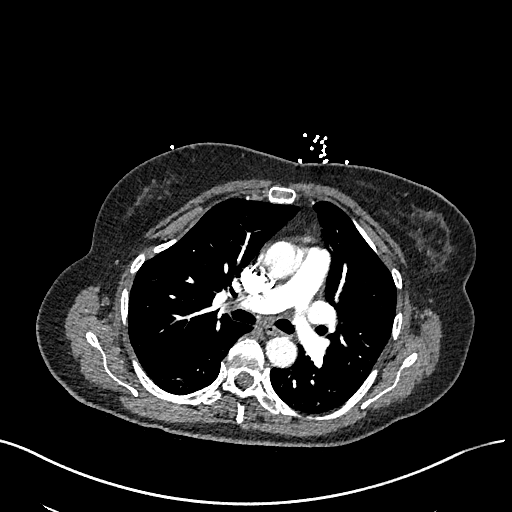
[im 233/350  lung]
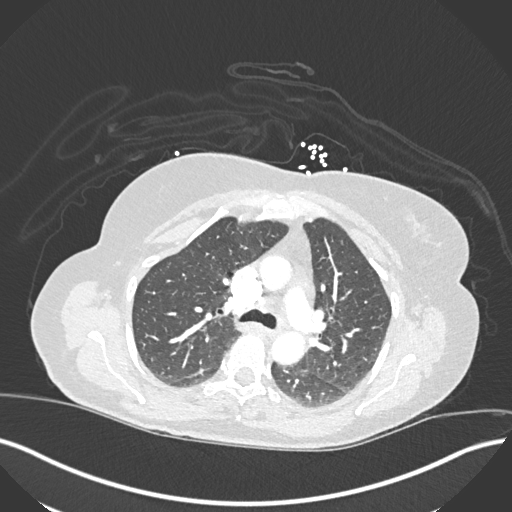
[im 272/350  soft-tissue]
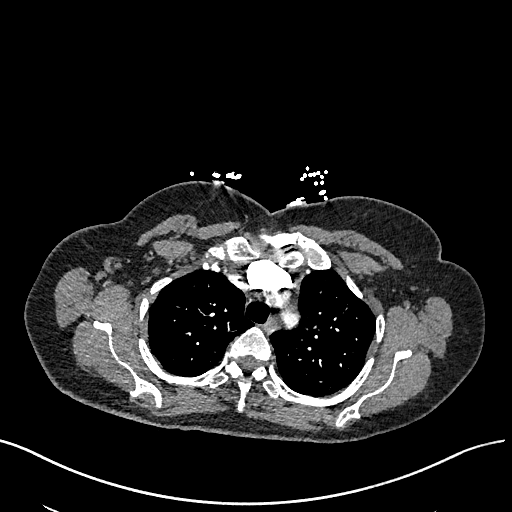
[im 291/350  lung]
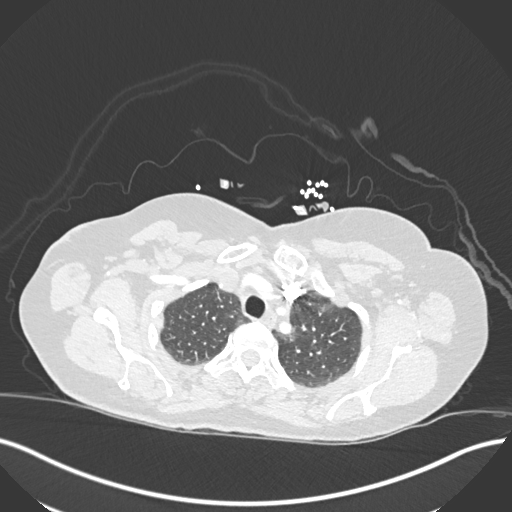
[im 311/350  soft-tissue]
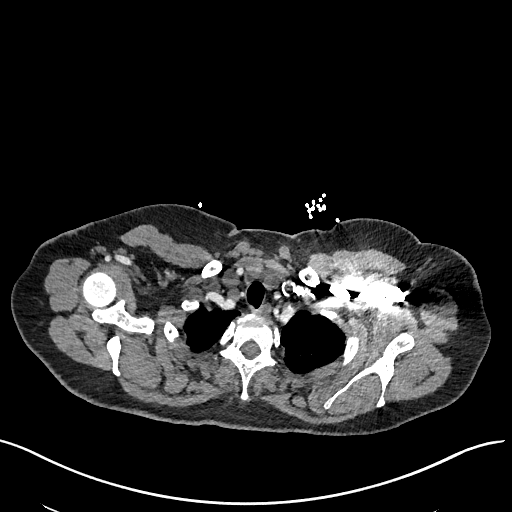
[im 330/350  lung]
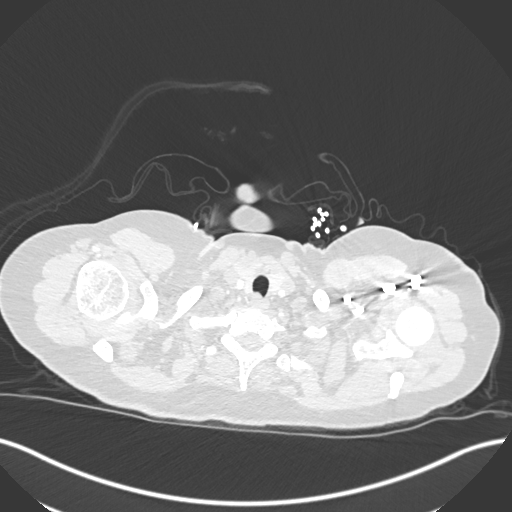

[Series 11: cor soft · coronal · 0.60mm/px · 3 of 137 slices shown]
[im 35/137  soft-tissue]
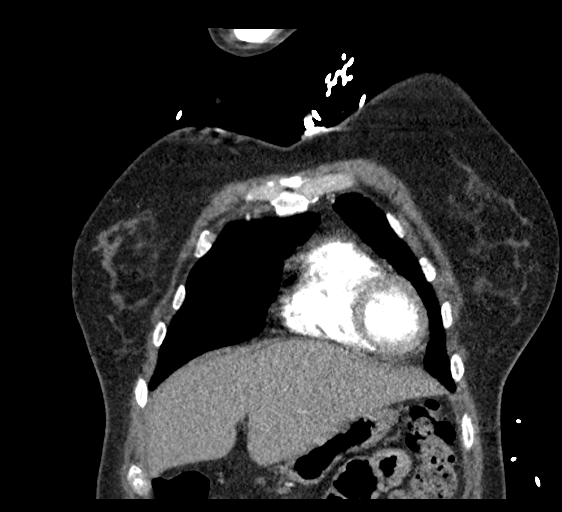
[im 69/137  soft-tissue]
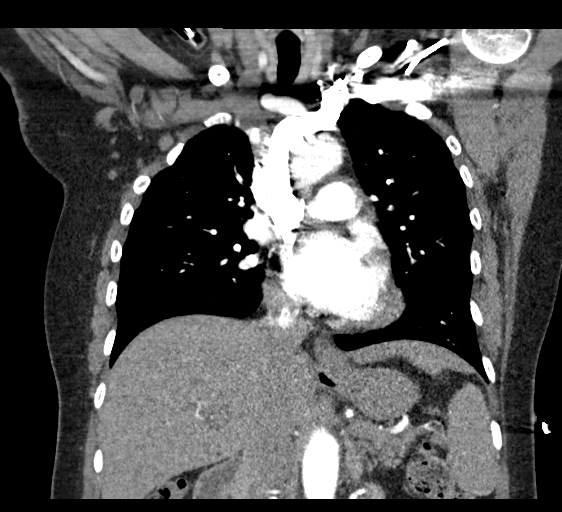
[im 103/137  soft-tissue]
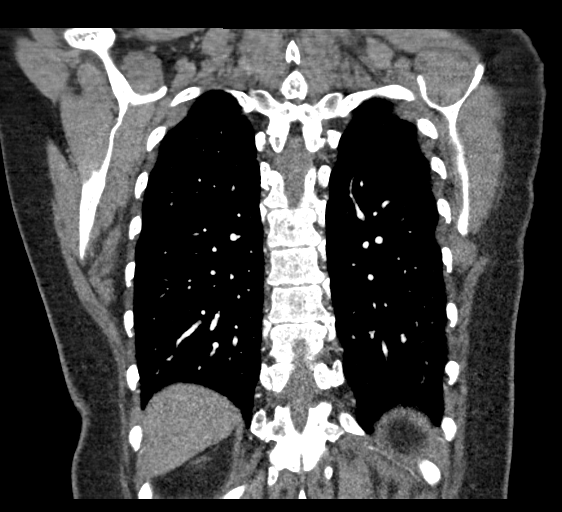

[18 of 46 positions shown; findings below may reference images not displayed]

RADIATION DOSE REDUCTION: This exam was performed according to the
departmental dose-optimization program which includes automated
exposure control, adjustment of the mA and/or kV according to
patient size and/or use of iterative reconstruction technique.

CONTRAST:  75mL OMNIPAQUE IOHEXOL 350 MG/ML SOLN
FINDINGS: Cardiovascular: There is homogeneous enhancement in thoracic aorta.
There are no intraluminal filling defects in the pulmonary artery
branches.

Mediastinum/Nodes: No significant lymphadenopathy seen.

Lungs/Pleura: There is no focal pulmonary consolidation. There is no
pleural effusion or pneumothorax.

Upper Abdomen: Surgical clips are seen in gallbladder fossa.

Musculoskeletal: There is slight decrease in height of upper
endplate of body of T10 vertebra, possibly mild old compression.
Schmorl's node is seen in the upper endplate of body of T10
vertebra.

Review of the MIP images confirms the above findings.
IMPRESSION: There is no evidence of pulmonary artery embolism. There is no
evidence of thoracic aortic dissection. There is no focal pulmonary
consolidation.

Slight decrease in height of upper endplate of body of T10 vertebra
may be residual from previous injury.

## 2023-06-13 IMAGING — DX DG CHEST 1V PORT
1 series · 1 of 1 positions shown · non-contrast
Comparison: January 07, 2018

CLINICAL DATA: Chest pain worsening today.

EXAM:
PORTABLE CHEST 1 VIEW

[chest ap]
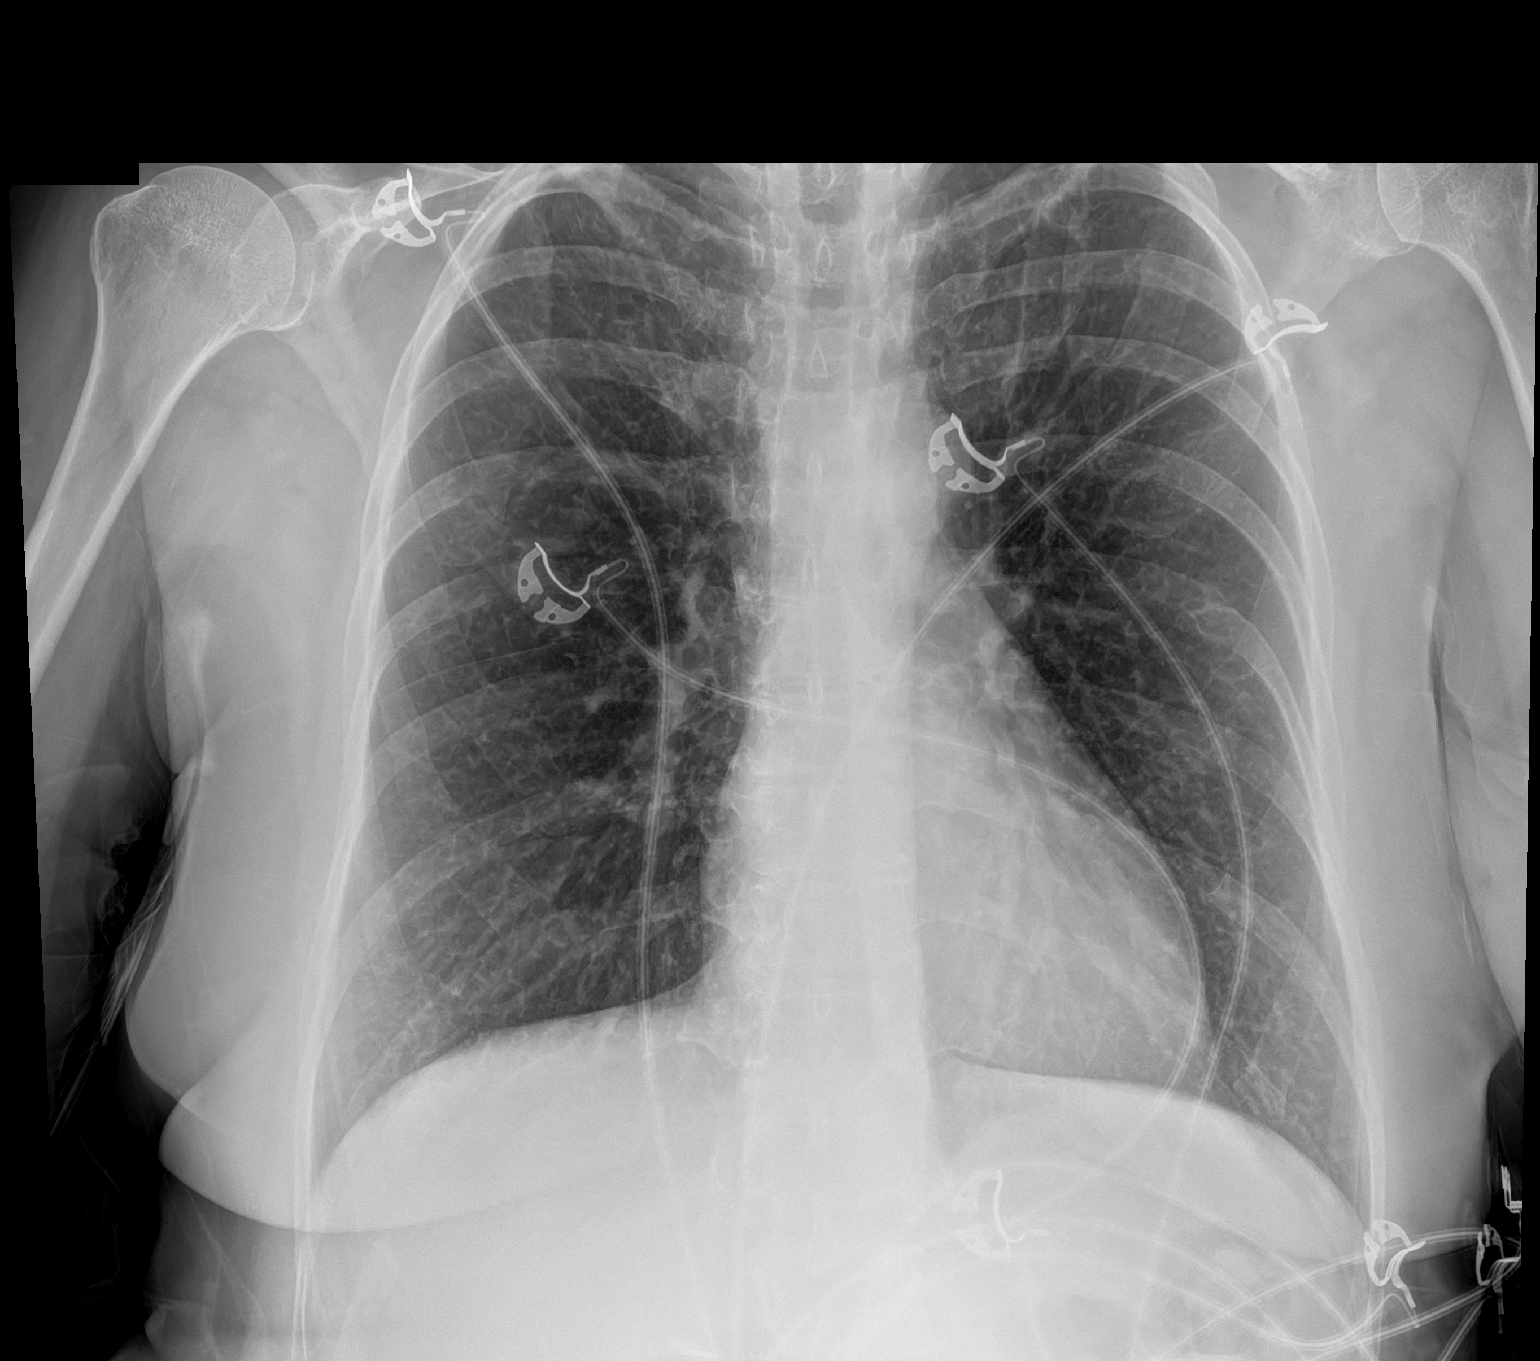

[1 of 1 positions shown; findings below may reference images not displayed]

FINDINGS: The heart size and mediastinal contours are within normal limits.
Both lungs are clear. The visualized skeletal structures are
unremarkable.
IMPRESSION: No active disease.

## 2023-07-25 ENCOUNTER — Ambulatory Visit: Payer: MEDICAID | Admitting: Adult Health

## 2024-01-17 ENCOUNTER — Other Ambulatory Visit (HOSPITAL_COMMUNITY): Payer: Self-pay | Admitting: *Deleted

## 2024-01-17 DIAGNOSIS — Z1231 Encounter for screening mammogram for malignant neoplasm of breast: Secondary | ICD-10-CM

## 2024-01-25 ENCOUNTER — Encounter: Payer: Self-pay | Admitting: *Deleted

## 2024-01-29 ENCOUNTER — Ambulatory Visit (HOSPITAL_COMMUNITY): Payer: MEDICAID

## 2024-02-08 ENCOUNTER — Ambulatory Visit (HOSPITAL_COMMUNITY)
Admission: RE | Admit: 2024-02-08 | Discharge: 2024-02-08 | Disposition: A | Discharge status: Home / Self Care | Payer: MEDICAID | Source: Ambulatory Visit | Attending: *Deleted | Admitting: *Deleted

## 2024-02-08 ENCOUNTER — Encounter (HOSPITAL_COMMUNITY): Payer: Self-pay

## 2024-02-08 DIAGNOSIS — Z1231 Encounter for screening mammogram for malignant neoplasm of breast: Secondary | ICD-10-CM | POA: Diagnosis present

## 2024-07-30 ENCOUNTER — Encounter: Payer: Self-pay | Admitting: *Deleted

## 2024-07-30 ENCOUNTER — Other Ambulatory Visit (HOSPITAL_COMMUNITY): Payer: Self-pay | Admitting: Nurse Practitioner

## 2024-07-30 DIAGNOSIS — N951 Menopausal and female climacteric states: Secondary | ICD-10-CM

## 2024-08-02 ENCOUNTER — Ambulatory Visit (HOSPITAL_COMMUNITY)
Admission: RE | Admit: 2024-08-02 | Discharge: 2024-08-02 | Disposition: A | Discharge status: Home / Self Care | Payer: MEDICAID | Source: Ambulatory Visit | Attending: Nurse Practitioner | Admitting: Nurse Practitioner

## 2024-08-02 DIAGNOSIS — N951 Menopausal and female climacteric states: Secondary | ICD-10-CM | POA: Diagnosis present

## 2024-08-23 ENCOUNTER — Encounter: Payer: Self-pay | Admitting: Obstetrics & Gynecology

## 2024-08-23 ENCOUNTER — Ambulatory Visit: Payer: MEDICAID | Admitting: Obstetrics & Gynecology

## 2024-08-23 ENCOUNTER — Other Ambulatory Visit: Payer: Self-pay | Admitting: Medical Genetics

## 2024-08-23 VITALS — BP 101/73 | HR 80 | Ht 66.0 in | Wt 147.0 lb

## 2024-08-23 DIAGNOSIS — N83202 Unspecified ovarian cyst, left side: Secondary | ICD-10-CM

## 2024-08-23 NOTE — Progress Notes (Signed)
 Chief Complaint  Patient presents with   Ovarian Cyst      53 y.o. [id number] Patient's last menstrual period was 04/07/2024. The current method of family planning is post menopausal status.  Outpatient Encounter Medications as of 08/23/2024  Medication Sig   buprenorphine-naloxone (SUBOXONE) 8-2 mg SUBL SL tablet Place 2 tablets under the tongue daily.   estradiol (CLIMARA - DOSED IN MG/24 HR) 0.05 mg/24hr patch Place 0.05 mg onto the skin once a week.   progesterone (PROMETRIUM) 100 MG capsule Take 100 mg by mouth daily.   Multiple Vitamin (MULTIVITAMIN WITH MINERALS) TABS tablet Take 1 tablet by mouth daily.   [DISCONTINUED] alum & mag hydroxide-simeth (MAALOX PLUS) 400-400-40 MG/5ML suspension Take 15 mLs by mouth every 6 (six) hours as needed for indigestion. (Patient not taking: Reported on 02/25/2022)   [DISCONTINUED] cetirizine (ZYRTEC) 10 MG tablet Take 10 mg by mouth daily.   [DISCONTINUED] hydrOXYzine  (ATARAX ) 25 MG tablet Take 1 tablet (25 mg total) by mouth every 6 (six) hours as needed for anxiety. (Patient not taking: Reported on 02/25/2022)   [DISCONTINUED] ibuprofen  (ADVIL ,MOTRIN ) 200 MG tablet Take 200 mg by mouth every 6 (six) hours as needed for mild pain or moderate pain. (Patient not taking: Reported on 02/25/2022)   [DISCONTINUED] loperamide  (IMODIUM ) 2 MG capsule Take 1 capsule (2 mg total) by mouth 4 (four) times daily as needed for diarrhea or loose stools. (Patient not taking: Reported on 02/25/2022)   [DISCONTINUED] ondansetron  (ZOFRAN -ODT) 4 MG disintegrating tablet 4mg  ODT q4 hours prn nausea/vomit (Patient not taking: Reported on 02/25/2022)   No facility-administered encounter medications on file as of 08/23/2024.    Subjective Referred for ovarian mass Minimal symptoms Past Medical History:  Diagnosis Date   Anxiety associated with depression    BV (bacterial vaginosis)    Drug abuse (HCC)    Hepatitis C    HSV (herpes simplex virus) infection     Overdose of heroin (HCC)     Past Surgical History:  Procedure Laterality Date   CHOLECYSTECTOMY     TUMOR REMOVAL     abdomen @ age 25    OB History     Gravida  3   Para  2   Term  2   Preterm      AB  1   Living  2      SAB      IAB  1   Ectopic      Multiple      Live Births              Allergies  Allergen Reactions   Coconut (Cocos Nucifera) Hives   Erythromycin Hives   Penicillins Hives    Social History   Socioeconomic History   Marital status: Significant Other    Spouse name: Not on file   Number of children: Not on file   Years of education: Not on file   Highest education level: Not on file  Occupational History   Not on file  Tobacco Use   Smoking status: Some Days    Current packs/day: 0.50    Types: Cigarettes, E-cigarettes, Cigars   Smokeless tobacco: Never  Vaping Use   Vaping status: Never Used  Substance and Sexual Activity   Alcohol use: Yes    Alcohol/week: 1.0 standard drink of alcohol    Types: 1 Standard drinks or equivalent per week    Comment: 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   Drug  use: Yes    Types: Marijuana, Fentanyl, Methamphetamines, Cocaine, Heroin    Comment: last use 02/16/22   Sexual activity: Yes    Partners: Male    Birth control/protection: Condom  Other Topics Concern   Not on file  Social History Narrative   Not on file   Social Drivers of Health   Financial Resource Strain: Medium Risk (08/25/2020)   Overall Financial Resource Strain (CARDIA)    Difficulty of Paying Living Expenses: Somewhat hard  Food Insecurity: Food Insecurity Present (08/25/2020)   Hunger Vital Sign    Worried About Running Out of Food in the Last Year: Sometimes true    Ran Out of Food in the Last Year: Sometimes true  Transportation Needs: Unmet Transportation Needs (08/25/2020)   PRAPARE - Administrator, Civil Service (Medical): Yes    Lack of Transportation (Non-Medical): Yes  Physical Activity:  Inactive (08/25/2020)   Exercise Vital Sign    Days of Exercise per Week: 0 days    Minutes of Exercise per Session: 0 min  Stress: Stress Concern Present (08/25/2020)   [employer] of Occupational Health - Occupational Stress Questionnaire    Feeling of Stress : Very much  Social Connections: Socially Isolated (08/25/2020)   Social Connection and Isolation Panel    Frequency of Communication with Friends and Family: Twice a week    Frequency of Social Gatherings with Friends and Family: Never    Attends Religious Services: Never    Database Administrator or Organizations: No    Attends Engineer, Structural: Never    Marital Status: Divorced    Family History  Problem Relation Age of Onset   Diabetes Mother    Hypertension Mother    Depression Mother    Cancer Father        testicular   Depression Maternal Grandmother    Heart disease Maternal Grandmother    Hypertension Maternal Grandmother    Cancer Paternal Grandfather     Medications:       Current Outpatient Medications:    buprenorphine-naloxone (SUBOXONE) 8-2 mg SUBL SL tablet, Place 2 tablets under the tongue daily., Disp: , Rfl:    estradiol (CLIMARA - DOSED IN MG/24 HR) 0.05 mg/24hr patch, Place 0.05 mg onto the skin once a week., Disp: , Rfl:    progesterone (PROMETRIUM) 100 MG capsule, Take 100 mg by mouth daily., Disp: , Rfl:    Multiple Vitamin (MULTIVITAMIN WITH MINERALS) TABS tablet, Take 1 tablet by mouth daily., Disp: , Rfl:   Objective Blood pressure 101/73, pulse 80, height 5' 6 (1.676 m), weight 147 lb (66.7 kg), last menstrual period 04/07/2024.  WDWN NAD  Pertinent ROS No burning with urination, frequency or urgency No nausea, vomiting or diarrhea Nor fever chills or other constitutional symptoms   Labs or studies Narrative & Impression  CLINICAL DATA:  Initial evaluation for irregular shaped uterus. Patient is postmenopausal per provided history.   EXAM: TRANSABDOMINAL AND  TRANSVAGINAL ULTRASOUND OF PELVIS   TECHNIQUE: Both transabdominal and transvaginal ultrasound examinations of the pelvis were performed. Transabdominal technique was performed for global imaging of the pelvis including uterus, ovaries, adnexal regions, and pelvic cul-de-sac. It was necessary to proceed with endovaginal exam following the transabdominal exam to visualize the endometrium.   COMPARISON:  None Available.   FINDINGS: Uterus   Measurements: 9.1 x 4.1 x 5.3 cm = volume: 103.0 mL. Heterogeneous echotexture within the uterine myometrium. No visible discrete fibroid or other myometrial  abnormality.   Endometrium   Thickness: 8.3 mm.  No focal abnormality visualized.   Right ovary   Not visualized.  No right adnexal mass.   Left ovary   The native left ovary is not visualized. There is a large cyst within the left adnexa measuring 10.5 x 6.0 x 9.4 cm. Cyst is predominantly simple in appearance, although there is some layering echogenic material within, which could reflect proteinaceous material and/or debris. No visible solid nodularity or vascularity.   Other findings   No abnormal free fluid.   IMPRESSION: 1. Large 10.5 cm left adnexal cyst. Finding is indeterminate, but could reflect a cystic ovarian neoplasm. Recommend GYN consult, and consider pelvis MRI w/o and w/ contrast if clinically warranted. Note: This recommendation does not apply to premenarchal patients or to those with increased risk (genetic, family history, elevated tumor markers or other high-risk factors) of ovarian cancer. Reference: Radiology 2019 Nov; 293(2):359-371. 2. Endometrial stripe measures 8.3 mm in thickness, considered abnormal for an asymptomatic post-menopausal female. Endometrial sampling should be considered to exclude carcinoma. 3. Nonvisualization of either native ovary.  No free fluid. 4. Normal sonographic appearance of the uterus.   These results will be called to  the ordering clinician or representative by the Radiologist Assistant, and communication documented in the PACS or [employer].     Electronically Signed   By: Morene Hoard M.D.   On: 08/05/2024 06:26      Impression + Management Plan: Diagnoses this Encounter::   ICD-10-CM   1. Left ovarian cyst: 11 cm simple likely BON  N83.202 Ovarian Malignancy Risk-ROMA   Recommend surgical removal and will proceed with RA TLH BSO, ROMA pending        Medications prescribed during  this encounter: No orders of the defined types were placed in this encounter.   Labs or Scans Ordered during this encounter: Orders Placed This Encounter  Procedures   Ovarian Malignancy Risk-ROMA      Follow up Return in about 9 weeks (around 10/25/2024) for Post Op, with Dr Jayne.

## 2024-08-27 LAB — PREMENOPAUSAL INTERP: HIGH

## 2024-08-27 LAB — OVARIAN MALIGNANCY RISK-ROMA
Cancer Antigen (CA) 125: 24.9 U/mL (ref 0.0–38.1)
HE4: 68.6 pmol/L (ref 0.0–105.2)
Postmenopausal ROMA: 2.08
Premenopausal ROMA: 1.5 — AB

## 2024-08-27 LAB — POSTMENOPAUSAL INTERP: LOW

## 2024-08-28 ENCOUNTER — Encounter: Payer: Self-pay | Admitting: Obstetrics & Gynecology

## 2024-08-28 DIAGNOSIS — N83202 Unspecified ovarian cyst, left side: Secondary | ICD-10-CM

## 2024-08-30 ENCOUNTER — Other Ambulatory Visit (HOSPITAL_COMMUNITY): Payer: Self-pay | Admitting: Obstetrics & Gynecology

## 2024-08-30 DIAGNOSIS — N83202 Unspecified ovarian cyst, left side: Secondary | ICD-10-CM

## 2024-08-30 NOTE — Addendum Note (Signed)
 Addended by: Piedad Standiford H on: 08/30/2024 12:36 PM   Modules accepted: Orders

## 2024-09-02 ENCOUNTER — Encounter: Payer: Self-pay | Admitting: Obstetrics & Gynecology

## 2024-09-26 ENCOUNTER — Other Ambulatory Visit (HOSPITAL_COMMUNITY): Payer: Self-pay | Admitting: Obstetrics & Gynecology

## 2024-09-26 DIAGNOSIS — Z01818 Encounter for other preprocedural examination: Secondary | ICD-10-CM

## 2024-09-26 NOTE — Patient Instructions (Signed)
 Alyssa Norris  09/26/2024     @PREFPERIOPPHARMACY @   Your procedure is scheduled on 10/01/2024.   [facility] to [facility] at  0800  A.M.   Call this number if you have problems the morning of surgery:  [phone]  If you experience any cold or flu symptoms such as cough, fever, chills, shortness of breath, etc. between now and your scheduled surgery, please notify us  at the above number.   Remember:  Do not eat after midnight.   You may drink clear liquids until 0600 am on 10/01/2024.      Clear liquids allowed are:                    Water , Carbonated beverages (diabetics please choose diet or no sugar options), Black Coffee Only (No creamer, milk or cream, including half & half and powdered creamer), and Clear Sports drink (No red color; diabetics please choose diet or no sugar options)          At 0600 am on 10/01/2024 drink your carb drink. You can have nothing else to drink after this.   Take these medicines the morning of surgery with A SIP OF WATER                                                        buspar.    Do not wear jewelry, make-up or nail polish, including gel polish,  artificial nails, or any other type of covering on natural nails (fingers and  toes).  Do not wear lotions, powders, or perfumes, or deodorant.  Do not shave 48 hours prior to surgery.  Men may shave face and neck.  Do not bring valuables to the hospital.  [facility] is not responsible for any belongings or valuables.  Contacts, dentures or bridgework may not be worn into surgery.  Leave your suitcase in the car.  After surgery it may be brought to your room.  For patients admitted to the hospital, discharge time will be determined by your treatment team.  Patients discharged the day of surgery will not be allowed to drive home and must have someone with them for 24 hours.    Special instructions:  DO NOT smoke tobacco or vape for 24 hours before your procedure.  Please  read over the following fact sheets that you were given. Coughing and Deep Breathing, Blood Transfusion Information, Surgical Site Infection Prevention, Anesthesia Post-op Instructions, and Care and Recovery After Surgery      Laparoscopically Assisted Vaginal Hysterectomy, Care After After a LAVH, it's common to have soreness and numbness in your incision areas. You'll have pain in your belly. You may also have: Bleeding and discharge from the vagina. Tiredness. Sadness and other emotions. If your ovaries were taken out, you may also have symptoms of menopause, such as hot flashes, night sweats, and lack of sleep. Follow these instructions at home: Medicines Take over-the-counter and prescription medicines only as told by your health care provider. If you were given antibiotics, take them as told by your provider. Do not stop taking the antibiotic even if you start to feel better. Ask your provider if the medicine prescribed to you: Requires you to avoid driving or using machinery. Can cause constipation. You may need to take these  actions to prevent or treat constipation: Drink enough fluid to keep your pee (urine) pale yellow. Take over-the-counter or prescription medicines. Eat foods that are high in fiber, such as beans, whole grains, and fresh fruits and vegetables. Limit foods that are high in fat and processed sugars, such as fried or sweet foods. Incision care  Follow instructions from your provider about how to take care of your incisions. Make sure you: Wash your hands with soap and water for at least 20 seconds before and after you change your bandage. If soap and water aren't available, use hand sanitizer. Change your dressing as told by your provider. Leave stitches, skin glue, or tape strips in place. These skin closures may need to stay in place for 2 weeks or longer. If tape strip edges start to loosen and curl up, you may trim the loose edges. Do not remove tape strips  completely unless your provider tells you to do that. Check your incision areas every day for signs of infection. Check for: More redness, swelling, or pain. More fluid or blood. Warmth. Pus or a bad smell. Activity  Rest as told by your provider. Do not sit for a long time without moving. Get up to take short walks every 1-2 hours. This will improve blood flow and breathing. Ask for help if you feel weak or unsteady. You may have to avoid lifting. Ask your provider how much you can safely lift. Return to your normal activities as told by your provider. Ask your provider what activities are safe for you. Lifestyle Do not use any products that contain nicotine or tobacco. These products include cigarettes, chewing tobacco, and vaping devices, such as e-cigarettes. These can delay healing after surgery. If you need help quitting, ask your provider. Do not drink alcohol until your provider approves. General instructions Do not douche, use tampons, have sex, or put anything in the vagina for at least 6 weeks. If you struggle with physical or emotional changes after your procedure, speak with your provider or a therapist. Do not take baths, swim, or use a hot tub until your provider approves. Ask your provider if you may take showers. You may only be allowed to take sponge baths. Try to have someone at home with you for the first 1-2 weeks to help with your daily chores. Wear compression stockings as told by your provider. These stockings help to prevent blood clots and reduce swelling in your legs. Your provider may give you more instructions. Make sure you know what you can and can't do. Contact a health care provider if: You have any signs of infection. You have pain and your pain medicine doesn't help. You feel dizzy or light-headed. You have trouble peeing. You vomit or feel like you may vomit, and the symptoms do not go away. You have pus or discharge from your vagina that smells  bad. Get help right away if: You have a fever and your symptoms suddenly get worse. You have very bad pain in the abdomen. You have chest pain or shortness of breath. You faint. You have pain, swelling, or redness in your leg. You have heavy bleeding in your vagina that soaks through a pad in less than 1 hour. You see blood clots in your bleeding. These symptoms may be an emergency. Get help right away. Call 911. Do not wait to see if the symptoms will go away. Do not drive yourself to the hospital. This information is not intended to replace advice given to  you by your health care provider. Make sure you discuss any questions you have with your health care provider. Document Revised: 02/03/2023 Document Reviewed: 02/03/2023 Elsevier Patient Education  2024 [employer]General Anesthesia, Adult, Care After The following information offers guidance on how to care for yourself after your procedure. Your health care provider may also give you more specific instructions. If you have problems or questions, contact your health care provider. What can I expect after the procedure? After the procedure, it is common for people to: Have pain or discomfort at the IV site. Have nausea or vomiting. Have a sore throat or hoarseness. Have trouble concentrating. Feel cold or chills. Feel weak, sleepy, or tired (fatigue). Have soreness and body aches. These can affect parts of the body that were not involved in surgery. Follow these instructions at home: For the time period you were told by your health care provider:  Rest. Do not participate in activities where you could fall or become injured. Do not drive or use machinery. Do not drink alcohol. Do not take sleeping pills or medicines that cause drowsiness. Do not make important decisions or sign legal documents. Do not take care of children on your own. General instructions Drink enough fluid to keep your urine pale yellow. If you have sleep  apnea, surgery and certain medicines can increase your risk for breathing problems. Follow instructions from your health care provider about wearing your sleep device: Anytime you are sleeping, including during daytime naps. While taking prescription pain medicines, sleeping medicines, or medicines that make you drowsy. Return to your normal activities as told by your health care provider. Ask your health care provider what activities are safe for you. Take over-the-counter and prescription medicines only as told by your health care provider. Do not use any products that contain nicotine or tobacco. These products include cigarettes, chewing tobacco, and vaping devices, such as e-cigarettes. These can delay incision healing after surgery. If you need help quitting, ask your health care provider. Contact a health care provider if: You have nausea or vomiting that does not get better with medicine. You vomit every time you eat or drink. You have pain that does not get better with medicine. You cannot urinate or have bloody urine. You develop a skin rash. You have a fever. Get help right away if: You have trouble breathing. You have chest pain. You vomit blood. These symptoms may be an emergency. Get help right away. Call 911. Do not wait to see if the symptoms will go away. Do not drive yourself to the hospital. Summary After the procedure, it is common to have a sore throat, hoarseness, nausea, vomiting, or to feel weak, sleepy, or fatigue. For the time period you were told by your health care provider, do not drive or use machinery. Get help right away if you have difficulty breathing, have chest pain, or vomit blood. These symptoms may be an emergency. This information is not intended to replace advice given to you by your health care provider. Make sure you discuss any questions you have with your health care provider. Document Revised: 01/21/2022 Document Reviewed: 01/21/2022 Elsevier  Patient Education  2024 [employer].How to Use Chlorhexidine at Home in the Shower Chlorhexidine gluconate (CHG) is a germ-killing (antiseptic) wash that's used to clean the skin. It can get rid of the germs that normally live on the skin and can keep them away for about 24 hours. If you're having surgery, you may be told to shower with CHG  at home the night before surgery. This can help lower your risk for infection. To use CHG wash in the shower, follow the steps below. Supplies needed: CHG body wash. Clean washcloth. Clean towel. How to use CHG in the shower Follow these steps unless you're told to use CHG in a different way: Start the shower. Use your normal soap and shampoo to wash your face and hair. Turn off the shower or move out of the shower stream. Pour CHG onto a clean washcloth. Do not use any type of brush or rough sponge. Start at your neck, washing your body down to your toes. Make sure you: Wash the part of your body where the surgery will be done for at least 1 minute. Do not scrub. Do not use CHG on your head or face unless your health care provider tells you to. If it gets into your ears or eyes, rinse them well with water. Do not wash your genitals with CHG. Wash your back and under your arms. Make sure to wash skin folds. Let the CHG sit on your skin for 1-2 minutes or as long as told. Rinse your entire body in the shower, including all body creases and folds. Turn off the shower. Dry off with a clean towel. Do not put anything on your skin afterward, such as powder, lotion, or perfume. Put on clean clothes or pajamas. If it's the night before surgery, sleep in clean sheets. General tips Use CHG only as told, and follow the instructions on the label. Use the full amount of CHG as told. This is often one bottle. Do not smoke and stay away from flames after using CHG. Your skin may feel sticky after using CHG. This is normal. The sticky feeling will go away as the  CHG dries. Do not use CHG: If you have a chlorhexidine allergy or have reacted to chlorhexidine in the past. On open wounds or areas of skin that have broken skin, cuts, or scrapes. On babies younger than 61 months of age. Contact a health care provider if: You have questions about using CHG. Your skin gets irritated or itchy. You have a rash after using CHG. You swallow any CHG. Call your local poison control center [phone] in the [place] Your eyes itch badly, or they become very red or swollen. Your hearing changes. You have trouble seeing. If you can't reach your provider, go to an urgent care or emergency room. Do not drive yourself. Get help right away if: You have swelling or tingling in your mouth or throat. You make high-pitched whistling sounds when you breathe, most often when you breathe out (wheeze). You have trouble breathing. These symptoms may be an emergency. Call 911 right away. Do not wait to see if the symptoms will go away. Do not drive yourself to the hospital. This information is not intended to replace advice given to you by your health care provider. Make sure you discuss any questions you have with your health care provider. Document Revised: 05/09/2023 Document Reviewed: 05/05/2022 Elsevier Patient Education  2024 [employer].

## 2024-09-27 ENCOUNTER — Other Ambulatory Visit: Payer: Self-pay

## 2024-09-27 ENCOUNTER — Encounter (HOSPITAL_COMMUNITY): Payer: Self-pay

## 2024-09-27 ENCOUNTER — Encounter (HOSPITAL_COMMUNITY)
Admission: RE | Admit: 2024-09-27 | Discharge: 2024-09-27 | Disposition: A | Discharge status: Home / Self Care | Payer: MEDICAID | Source: Ambulatory Visit | Attending: Obstetrics & Gynecology | Admitting: Obstetrics & Gynecology

## 2024-09-27 DIAGNOSIS — Z01818 Encounter for other preprocedural examination: Secondary | ICD-10-CM

## 2024-09-27 DIAGNOSIS — Z01812 Encounter for preprocedural laboratory examination: Secondary | ICD-10-CM | POA: Diagnosis present

## 2024-09-27 LAB — TYPE AND SCREEN
ABO/RH(D): A NEG
Antibody Screen: NEGATIVE

## 2024-09-27 LAB — URINALYSIS, ROUTINE W REFLEX MICROSCOPIC
Bilirubin Urine: NEGATIVE
Glucose, UA: NEGATIVE mg/dL
Ketones, ur: NEGATIVE mg/dL
Nitrite: NEGATIVE
Protein, ur: 30 mg/dL — AB
Specific Gravity, Urine: 1.021 (ref 1.005–1.030)
pH: 5 (ref 5.0–8.0)

## 2024-09-27 LAB — CBC
HCT: 41 % (ref 36.0–46.0)
Hemoglobin: 13.6 g/dL (ref 12.0–15.0)
MCH: 29.8 pg (ref 26.0–34.0)
MCHC: 33.2 g/dL (ref 30.0–36.0)
MCV: 89.9 fL (ref 80.0–100.0)
Platelets: 294 K/uL (ref 150–400)
RBC: 4.56 MIL/uL (ref 3.87–5.11)
RDW: 12.4 % (ref 11.5–15.5)
WBC: 6.4 K/uL (ref 4.0–10.5)
nRBC: 0 % (ref 0.0–0.2)

## 2024-09-27 LAB — COMPREHENSIVE METABOLIC PANEL WITH GFR
ALT: 10 U/L (ref 0–44)
AST: 20 U/L (ref 15–41)
Albumin: 4 g/dL (ref 3.5–5.0)
Alkaline Phosphatase: 90 U/L (ref 38–126)
Anion gap: 10 (ref 5–15)
BUN: 10 mg/dL (ref 6–20)
CO2: 24 mmol/L (ref 22–32)
Calcium: 8.5 mg/dL — ABNORMAL LOW (ref 8.9–10.3)
Chloride: 106 mmol/L (ref 98–111)
Creatinine, Ser: 0.52 mg/dL (ref 0.44–1.00)
GFR, Estimated: >60 mL/min (ref >60)
Glucose, Bld: 95 mg/dL (ref 70–99)
Potassium: 3.7 mmol/L (ref 3.5–5.1)
Sodium: 140 mmol/L (ref 135–145)
Total Bilirubin: 0.3 mg/dL (ref 0.0–1.2)
Total Protein: 6.9 g/dL (ref 6.5–8.1)

## 2024-09-27 LAB — RAPID HIV SCREEN (HIV 1/2 AB+AG)
HIV 1/2 Antibodies: NONREACTIVE
HIV-1 P24 Antigen - HIV24: NONREACTIVE

## 2024-09-27 LAB — PREGNANCY, URINE: Preg Test, Ur: NEGATIVE

## 2024-09-27 NOTE — Pre-Procedure Instructions (Signed)
 UA results routed to Dr Jayne.

## 2024-09-30 ENCOUNTER — Encounter: Payer: Self-pay | Admitting: Obstetrics & Gynecology

## 2024-10-01 ENCOUNTER — Ambulatory Visit (HOSPITAL_COMMUNITY): Payer: MEDICAID | Admitting: Certified Registered"

## 2024-10-01 ENCOUNTER — Encounter (HOSPITAL_COMMUNITY)
Admission: RE | Disposition: A | Discharge status: Home / Self Care | Payer: Self-pay | Source: Home / Self Care | Attending: Obstetrics & Gynecology

## 2024-10-01 ENCOUNTER — Encounter (HOSPITAL_COMMUNITY): Payer: Self-pay | Admitting: Obstetrics & Gynecology

## 2024-10-01 ENCOUNTER — Ambulatory Visit (HOSPITAL_COMMUNITY)
Admission: RE | Admit: 2024-10-01 | Discharge: 2024-10-01 | Disposition: A | Discharge status: Home / Self Care | Payer: MEDICAID | Attending: Obstetrics & Gynecology | Admitting: Obstetrics & Gynecology

## 2024-10-01 DIAGNOSIS — N736 Female pelvic peritoneal adhesions (postinfective): Secondary | ICD-10-CM | POA: Diagnosis not present

## 2024-10-01 DIAGNOSIS — D271 Benign neoplasm of left ovary: Secondary | ICD-10-CM

## 2024-10-01 DIAGNOSIS — N879 Dysplasia of cervix uteri, unspecified: Secondary | ICD-10-CM | POA: Insufficient documentation

## 2024-10-01 DIAGNOSIS — F17210 Nicotine dependence, cigarettes, uncomplicated: Secondary | ICD-10-CM | POA: Insufficient documentation

## 2024-10-01 DIAGNOSIS — N83202 Unspecified ovarian cyst, left side: Secondary | ICD-10-CM | POA: Diagnosis present

## 2024-10-01 DIAGNOSIS — N8003 Adenomyosis of the uterus: Secondary | ICD-10-CM | POA: Diagnosis not present

## 2024-10-01 DIAGNOSIS — Z01818 Encounter for other preprocedural examination: Secondary | ICD-10-CM

## 2024-10-01 HISTORY — PX: HYSTERECTOMY,TOTAL,BILAT SALPINGO-OOPHORECTOMY, ROBOT, LAP: SHX7359

## 2024-10-01 LAB — ABO/RH: ABO/RH(D): A NEG

## 2024-10-01 SURGERY — HYSTERECTOMY, TOTAL, BILATERAL SAPLINGO-OOPHORECTOMY, ROBOT ASSISTED, LAPAROSCOPIC, D5
Anesthesia: Monitor Anesthesia Care | Site: Abdomen

## 2024-10-01 MED ORDER — CEFAZOLIN SODIUM-DEXTROSE 2-4 GM/100ML-% IV SOLN
2.0000 g | INTRAVENOUS | Status: AC
  Administered 2024-10-01: 2 g via INTRAVENOUS
  Filled 2024-10-01: qty 100

## 2024-10-01 MED ORDER — OXYCODONE HCL 5 MG PO TABS
5.0000 mg | ORAL_TABLET | Freq: Once | ORAL | Status: AC | PRN
  Administered 2024-10-01: 5 mg via ORAL
  Filled 2024-10-01: qty 1

## 2024-10-01 MED ORDER — ONDANSETRON 8 MG PO TBDP: 8.0000 mg | ORAL_TABLET | Freq: Three times a day (TID) | ORAL | 0 refills | Status: AC | PRN

## 2024-10-01 MED ORDER — FENTANYL CITRATE (PF) 50 MCG/ML IJ SOSY
25.0000 ug | PREFILLED_SYRINGE | INTRAMUSCULAR | Status: DC | PRN
  Administered 2024-10-01 (×3): 50 ug via INTRAVENOUS
  Filled 2024-10-01 (×3): qty 1

## 2024-10-01 MED ORDER — ROCURONIUM BROMIDE 10 MG/ML (PF) SYRINGE
PREFILLED_SYRINGE | INTRAVENOUS | Status: DC | PRN
  Administered 2024-10-01: 20 mg via INTRAVENOUS
  Administered 2024-10-01: 60 mg via INTRAVENOUS

## 2024-10-01 MED ORDER — OXYCODONE HCL 5 MG/5ML PO SOLN: 5.0000 mg | Freq: Once | ORAL | Status: AC | PRN

## 2024-10-01 MED ORDER — FENTANYL CITRATE (PF) 100 MCG/2ML IJ SOLN
INTRAMUSCULAR | Status: AC
  Filled 2024-10-01: qty 2

## 2024-10-01 MED ORDER — HYDROMORPHONE HCL 1 MG/ML IJ SOLN
INTRAMUSCULAR | Status: AC
  Filled 2024-10-01: qty 0.5

## 2024-10-01 MED ORDER — GLYCOPYRROLATE PF 0.2 MG/ML IJ SOSY
PREFILLED_SYRINGE | INTRAMUSCULAR | Status: AC
  Filled 2024-10-01: qty 1

## 2024-10-01 MED ORDER — MIDAZOLAM HCL 5 MG/5ML IJ SOLN
INTRAMUSCULAR | Status: DC | PRN
  Administered 2024-10-01: 2 mg via INTRAVENOUS

## 2024-10-01 MED ORDER — GLYCOPYRROLATE PF 0.2 MG/ML IJ SOSY
PREFILLED_SYRINGE | INTRAMUSCULAR | Status: DC | PRN
  Administered 2024-10-01: .2 mg via INTRAVENOUS

## 2024-10-01 MED ORDER — LIDOCAINE 2% (20 MG/ML) 5 ML SYRINGE
INTRAMUSCULAR | Status: DC | PRN
  Administered 2024-10-01: 100 mg via INTRAVENOUS

## 2024-10-01 MED ORDER — PHENYLEPHRINE 80 MCG/ML (10ML) SYRINGE FOR IV PUSH (FOR BLOOD PRESSURE SUPPORT)
PREFILLED_SYRINGE | INTRAVENOUS | Status: AC
  Filled 2024-10-01: qty 10

## 2024-10-01 MED ORDER — MIDAZOLAM HCL 2 MG/2ML IJ SOLN
INTRAMUSCULAR | Status: AC
  Filled 2024-10-01: qty 2

## 2024-10-01 MED ORDER — KETAMINE HCL 50 MG/5ML IJ SOSY
PREFILLED_SYRINGE | INTRAMUSCULAR | Status: AC
Start: 2024-10-01 — End: 2024-10-01
  Filled 2024-10-01: qty 5

## 2024-10-01 MED ORDER — STERILE WATER FOR IRRIGATION IR SOLN
Status: DC | PRN
Start: 2024-10-01 — End: 2024-10-01
  Administered 2024-10-01: 1000 mL

## 2024-10-01 MED ORDER — ORAL CARE MOUTH RINSE: 15.0000 mL | Freq: Once | OROMUCOSAL | Status: AC

## 2024-10-01 MED ORDER — CHLORHEXIDINE GLUCONATE 0.12 % MT SOLN
15.0000 mL | Freq: Once | OROMUCOSAL | Status: AC
  Administered 2024-10-01: 15 mL via OROMUCOSAL

## 2024-10-01 MED ORDER — KETOROLAC TROMETHAMINE 30 MG/ML IJ SOLN
30.0000 mg | INTRAMUSCULAR | Status: AC
  Administered 2024-10-01: 30 mg via INTRAVENOUS
  Filled 2024-10-01: qty 1

## 2024-10-01 MED ORDER — ROCURONIUM BROMIDE 10 MG/ML (PF) SYRINGE
PREFILLED_SYRINGE | INTRAVENOUS | Status: AC
  Filled 2024-10-01: qty 10

## 2024-10-01 MED ORDER — DEXAMETHASONE SOD PHOSPHATE PF 10 MG/ML IJ SOLN
INTRAMUSCULAR | Status: DC | PRN
  Administered 2024-10-01: 8 mg via INTRAVENOUS

## 2024-10-01 MED ORDER — PHENYLEPHRINE HCL-NACL 20-0.9 MG/250ML-% IV SOLN
INTRAVENOUS | Status: DC | PRN
  Administered 2024-10-01: 20 ug/min via INTRAVENOUS

## 2024-10-01 MED ORDER — SCOPOLAMINE 1 MG/3DAYS TD PT72
MEDICATED_PATCH | TRANSDERMAL | Status: DC | PRN
  Administered 2024-10-01: 1 via TRANSDERMAL

## 2024-10-01 MED ORDER — PROPOFOL 10 MG/ML IV BOLUS
INTRAVENOUS | Status: AC
  Filled 2024-10-01: qty 20

## 2024-10-01 MED ORDER — HYDROMORPHONE HCL 1 MG/ML IJ SOLN
0.5000 mg | INTRAMUSCULAR | Status: AC | PRN
  Administered 2024-10-01 (×4): 0.5 mg via INTRAVENOUS
  Filled 2024-10-01 (×4): qty 0.5

## 2024-10-01 MED ORDER — ONDANSETRON HCL 4 MG/2ML IJ SOLN
INTRAMUSCULAR | Status: AC
  Filled 2024-10-01: qty 2

## 2024-10-01 MED ORDER — BUPIVACAINE HCL 0.25 % IJ SOLN
INTRAMUSCULAR | Status: DC | PRN
  Administered 2024-10-01: 40 mL

## 2024-10-01 MED ORDER — SCOPOLAMINE 1 MG/3DAYS TD PT72
MEDICATED_PATCH | TRANSDERMAL | Status: AC
  Filled 2024-10-01: qty 1

## 2024-10-01 MED ORDER — SUGAMMADEX SODIUM 200 MG/2ML IV SOLN
INTRAVENOUS | Status: DC | PRN
  Administered 2024-10-01 (×2): 200 mg via INTRAVENOUS

## 2024-10-01 MED ORDER — SODIUM CHLORIDE 0.9 % IR SOLN
Status: DC | PRN
  Administered 2024-10-01: 3000 mL

## 2024-10-01 MED ORDER — POVIDONE-IODINE 10 % EX SWAB: 2.0000 | Freq: Once | CUTANEOUS | Status: DC

## 2024-10-01 MED ORDER — OXYCODONE-ACETAMINOPHEN 7.5-325 MG PO TABS
1.0000 | ORAL_TABLET | Freq: Four times a day (QID) | ORAL | 0 refills | Status: AC | PRN
Start: 2024-10-01 — End: ?

## 2024-10-01 MED ORDER — KETOROLAC TROMETHAMINE 10 MG PO TABS: 10.0000 mg | ORAL_TABLET | Freq: Three times a day (TID) | ORAL | 0 refills | Status: AC | PRN

## 2024-10-01 MED ORDER — FENTANYL CITRATE (PF) 100 MCG/2ML IJ SOLN
INTRAMUSCULAR | Status: DC | PRN
  Administered 2024-10-01: 100 ug via INTRAVENOUS
  Administered 2024-10-01 (×2): 50 ug via INTRAVENOUS

## 2024-10-01 MED ORDER — LACTATED RINGERS IV SOLN: INTRAVENOUS | Status: DC

## 2024-10-01 MED ORDER — PROPOFOL 10 MG/ML IV BOLUS
INTRAVENOUS | Status: DC | PRN
  Administered 2024-10-01: 150 mg via INTRAVENOUS

## 2024-10-01 MED ORDER — PHENYLEPHRINE 80 MCG/ML (10ML) SYRINGE FOR IV PUSH (FOR BLOOD PRESSURE SUPPORT)
PREFILLED_SYRINGE | INTRAVENOUS | Status: DC | PRN
  Administered 2024-10-01: 80 ug via INTRAVENOUS
  Administered 2024-10-01: 160 ug via INTRAVENOUS

## 2024-10-01 MED ORDER — KETAMINE HCL 50 MG/5ML IJ SOSY
PREFILLED_SYRINGE | INTRAMUSCULAR | Status: DC | PRN
  Administered 2024-10-01: 50 mg via INTRAVENOUS

## 2024-10-01 MED ORDER — LIDOCAINE 2% (20 MG/ML) 5 ML SYRINGE
INTRAMUSCULAR | Status: AC
  Filled 2024-10-01: qty 5

## 2024-10-01 MED ORDER — ONDANSETRON HCL 4 MG/2ML IJ SOLN
4.0000 mg | Freq: Once | INTRAMUSCULAR | Status: AC | PRN
  Administered 2024-10-01: 4 mg via INTRAVENOUS
  Filled 2024-10-01: qty 2

## 2024-10-01 MED ORDER — BUPIVACAINE HCL (PF) 0.25 % IJ SOLN
INTRAMUSCULAR | Status: AC
  Filled 2024-10-01: qty 60

## 2024-10-01 MED ORDER — ONDANSETRON HCL 4 MG/2ML IJ SOLN
INTRAMUSCULAR | Status: DC | PRN
Start: 2024-10-01 — End: 2024-10-01
  Administered 2024-10-01: 4 mg via INTRAVENOUS

## 2024-10-01 MED ORDER — CIPROFLOXACIN HCL 500 MG PO TABS: 500.0000 mg | ORAL_TABLET | Freq: Two times a day (BID) | ORAL | 0 refills | Status: AC

## 2024-10-01 SURGICAL SUPPLY — 48 items
BLADE SURG SZ11 CARB STEEL (BLADE) ×1 IMPLANT
CAUTERY HOOK MNPLR 1.6 DVNC XI (INSTRUMENTS) ×1 IMPLANT
COVER LIGHT HANDLE STERIS (MISCELLANEOUS) ×2 IMPLANT
COVER MAYO STAND XLG (MISCELLANEOUS) ×1 IMPLANT
DERMABOND ADVANCED .7 DNX12 (GAUZE/BANDAGES/DRESSINGS) ×1 IMPLANT
DRAPE ARM DVNC X/XI (DISPOSABLE) ×4 IMPLANT
DRAPE COLUMN DVNC XI (DISPOSABLE) ×1 IMPLANT
DRIVER NDL MEGA SUTCUT DVNCXI (INSTRUMENTS) ×1 IMPLANT
DRIVER NDLE MEGA SUTCUT DVNCXI (INSTRUMENTS) ×1 IMPLANT
ELECTRODE REM PT RTRN 9FT ADLT (ELECTROSURGICAL) ×1 IMPLANT
FORCEPS PROGRASP DVNC XI (FORCEP) ×1 IMPLANT
GAUZE 4X4 16PLY ~~LOC~~+RFID DBL (SPONGE) ×2 IMPLANT
GLOVE BIOGEL PI IND STRL 7.0 (GLOVE) ×4 IMPLANT
GLOVE BIOGEL PI IND STRL 8 (GLOVE) ×2 IMPLANT
GLOVE ECLIPSE 8.0 STRL XLNG CF (GLOVE) ×3 IMPLANT
GOWN STRL REUS W/TWL LRG LVL3 (GOWN DISPOSABLE) ×2 IMPLANT
GOWN STRL REUS W/TWL XL LVL3 (GOWN DISPOSABLE) ×2 IMPLANT
KIT PINK PAD W/HEAD ARM REST (MISCELLANEOUS) ×1 IMPLANT
KIT TURNOVER CYSTO (KITS) ×1 IMPLANT
MANIFOLD NEPTUNE II (INSTRUMENTS) ×1 IMPLANT
NDL HYPO 21X1.5 SAFETY (NEEDLE) ×1 IMPLANT
NDL INSUFFLATION 14GA 120MM (NEEDLE) ×1 IMPLANT
NEEDLE HYPO 21X1.5 SAFETY (NEEDLE) ×1 IMPLANT
NEEDLE INSUFFLATION 14GA 120MM (NEEDLE) ×1 IMPLANT
OBTURATOR OPTICALSTD 8 DVNC (TROCAR) ×1 IMPLANT
PACK PERI GYN (CUSTOM PROCEDURE TRAY) ×1 IMPLANT
POWDER SURGICEL 3.0 GRAM (HEMOSTASIS) ×1 IMPLANT
RUMI II GYRUS 3.5CM BLUE (DISPOSABLE) IMPLANT
SEAL UNIV 5-12 XI (MISCELLANEOUS) ×3 IMPLANT
SEALER VESSEL EXT DVNC XI (MISCELLANEOUS) ×1 IMPLANT
SET BASIN LINEN APH (SET/KITS/TRAYS/PACK) ×1 IMPLANT
SET TUBE DA VINCI INSUFFLATOR (TUBING) ×1 IMPLANT
SET TUBE IRRIG SUCTION NO TIP (IRRIGATION / IRRIGATOR) ×1 IMPLANT
SOLN 0.9% NACL 3000 ML (IV SOLUTION) ×1 IMPLANT
SOLUTION ANTFG W/FOAM PAD STRL (MISCELLANEOUS) ×1 IMPLANT
SPONGE T-LAP 18X18 ~~LOC~~+RFID (SPONGE) ×1 IMPLANT
SUT VICRYL 0 UR6 27IN ABS (SUTURE) ×1 IMPLANT
SUT VICRYL AB 3-0 FS1 BRD 27IN (SUTURE) ×1 IMPLANT
SUTURE STRATFX 0 PDS+ CT-2 23 (SUTURE) ×1 IMPLANT
SYR 10ML LL (SYRINGE) ×2 IMPLANT
SYR 20ML LL LF (SYRINGE) ×1 IMPLANT
SYR 50ML LL SCALE MARK (SYRINGE) ×1 IMPLANT
SYR CONTROL 10ML LL (SYRINGE) ×2 IMPLANT
TIP ENDOSCOPIC SURGICEL (TIP) ×1 IMPLANT
TIP UTERINE 6.7X6CM WHT DISP (MISCELLANEOUS) IMPLANT
TRAY FOLEY SLVR 16FR LF STAT (SET/KITS/TRAYS/PACK) ×1 IMPLANT
TROCAR KII 8X100ML NONTHREADED (TROCAR) ×1 IMPLANT
WATER STERILE IRR 500ML POUR (IV SOLUTION) ×1 IMPLANT

## 2024-10-01 NOTE — Anesthesia Procedure Notes (Signed)
 Procedure Name: Intubation Date/Time: 10/01/2024 8:10 AM  Performed by: Jullie Redell SAUNDERS, CRNAPre-anesthesia Checklist: Patient identified, Emergency Drugs available, Suction available and Patient being monitored Patient Re-evaluated:Patient Re-evaluated prior to induction Oxygen Delivery Method: Circle system utilized Preoxygenation: Pre-oxygenation with 100% oxygen Induction Type: IV induction Ventilation: Mask ventilation without difficulty and Oral airway inserted - appropriate to patient size Laryngoscope Size: Mac and 3 Grade View: Grade I Tube type: Oral Tube size: 7.0 mm Number of attempts: 1 Airway Equipment and Method: Stylet and Oral airway Placement Confirmation: ETT inserted through vocal cords under direct vision, positive ETCO2 and breath sounds checked- equal and bilateral Secured at: 21 cm Tube secured with: Tape Dental Injury: Teeth and Oropharynx as per pre-operative assessment

## 2024-10-01 NOTE — Transfer of Care (Signed)
 Immediate Anesthesia Transfer of Care Note  Patient: Alyssa Norris  Procedure(s) Performed: HYSTERECTOMY, TOTAL, BILATERAL SAPLINGO-OOPHORECTOMY, ROBOT ASSISTED, LAPAROSCOPIC, D5 (Abdomen)  Patient Location: PACU  Anesthesia Type:General  Level of Consciousness: awake, alert , and oriented  Airway & Oxygen Therapy: Patient Spontanous Breathing and Patient connected to face mask oxygen  Post-op Assessment: Report given to RN and Post -op Vital signs reviewed and stable  Post vital signs: Reviewed and stable  Last Vitals:  Vitals Value Taken Time  BP 138/84 10/01/24 11:15  Temp 36.4 C 10/01/24 11:13  Pulse 53 10/01/24 11:16  Resp 12 10/01/24 11:16  SpO2 100 % 10/01/24 11:16  Vitals shown include unfiled device data.  Last Pain:  Vitals:   10/01/24 0709  TempSrc: Oral  PainSc: 0-No pain      Patients Stated Pain Goal: 5 (10/01/24 0709)  Complications: No notable events documented.

## 2024-10-01 NOTE — Op Note (Signed)
 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 Benign appearing left ovarian cyst Normal right ovary atrophic appearance Normal uterus and peritoneum   Description of operation: Patient was taken to the operating room and placed in the low lithotomy position She was prepped and draped in the usual sterile fashion robotic assisted laparoscopic procedure A Foley catheter was placed The vagina was once again prepped additionally   A RUMI II 6 cm with 3.5 cervical cup was placed for uterine manipulation and colpotomy delineation   An incision was made above the umbilicus The umbilical fascia was grasped A varies needle was used and placed into the peritoneum with 1 pass A pneumoperitoneum was created to a pressure of 15   An 8 mm port was placed into the peritoneal cavity using a nonbladed trocar easily with 1 pass using the video laparoscope The peritoneal cavity was confirmed   Omental adhesions noted and taken down with the VSE    3 additional 8 mm ports were placed at approximately the same level as the supraumbilical port 2 of the ports were robotic and 1 is an assist port   One was left lateral and 1 was placed right lateral, both lateral to the rectus anterior muscle These were placed under direct visualization without difficulty into the peritoneal cavity Nonbladed trocars were used in all instances    The patient was placed in 24 degrees of Trendelenburg   The [employer] robot was then docked to the 3 robotic ports    Instruments used during the robotic hysterectomy: Vessel sealer extender using bipolar energy ProGrasp forceps with no energy Monopolar hook Megacut needle driver 2-0 PDS symmetrical STRATAFIX on a CT 2 needle   I then left the patient and went to the surgical console   The RUMI II  was used throughout the case to apply traction and anterior/posterior displacement of the uterus to facilitate the robotic procedure The ProGrasp was used to put traction on the left adnexa The left ureter was identified and found to be well away from the adnexal vessels The vessel sealer extender using bipolar energy was used and the infundibulopelvic ligament was coagulated and then transected Mobilizing the left ovarian cyst resulted in disruption of clear fluid I used traction medially and anteriorly and used the vessel sealer to take the broad ligament and round ligament down to the level of the cervical isthmus just lateral to the left uterine vessels   I then turned my attention to the right adnexa The pro grasp was used and medial and upward traction was placed The vessel sealer extender using bipolar energy was used to coagulate and ligate the right infundibulopelvic ligament vessels Again the right ureter was well inferior to the vessels I continued use medial and anterior retraction using the ProGrasp and the vessel sealer with the bipolar energy was used to take the broad ligament and round ligament down to the level of the cervical isthmus and lateral to the uterine vessels   I then placed the monopolar hook in the place of the ProGrasp The vessel sealer extender was used to  grasp the peritoneum of the bladder provide traction, of course no energy was used for this I used the monopolar hook with energy to open of the peritoneal leaf anteriorly and dissect the bladder peritoneum off the lower uterine segment and cervix beyond the level of the vaginal colpotomy cup I then used the monopolar hook to  take the peritoneum down where I stopped using the vessel sealer and met in the bladder dissection bilaterally   The vessel sealer extender with monopolar energy was used to coagulate the uterine vessels at the level of the cervical isthmus bilaterally and then just above and just below to manage backbleeding The uterine vessels were transected There was good hemostasis I then stayed medial to this uterine vessel pedicle with the vessel sealer extender and took down the paracervical tissue to allow colpotomy incision using the monopolar hook, preserving the cardinal ligament Again there was good hemostasis bilaterally   I then used the monopolar hook and made a posterior colpotomy incision above the level of insertion of the uterosacral ligaments I used a frowny face then smiley face technique and opened the vagina to approximately 8:00 and 4:00 posteriorly I then used the vessel sealer extender with bipolar energy, coagulating and then transecting the vagina   The monopolar hook was used to make the anterior colpotomy incision as the bladder had been dissected well past the colpotomy ring Cephalad tension was placed on the Vcare handle throughout this portion of the procedure to prevent thermal injury to the bladder and safe distance from the ureters bilaterally Colpotomy incision was made from 10:00 to 2:00 The vessel sealer with bipolar energy was then used to coagulate and transect the vagina, again inside of the cardinal ligament   The uterus was removed through the vagina   The tubes and ovaries were also removed through the vagina A wet lap tape was placed in the vagina to maintain pneumoperitoneum   All pedicles were found to be hemostatic there was very little blood loss I then removed the vessel sealer and monopolar hook The pro grasp was replaced and the needle driver was also placed along with the suture   The vagina was closed using the 2-0 PDS STRATAFIX symmetrical suture on  the CT 2 needle I pay close attention to the vaginal corners bilaterally and incorporated those in the closure 1.5 cm of vagina was operating to the vaginal closure I also fixed the uterosacral ligaments to the vaginal cuff repair shortening the uterosacral ligaments thus providing improved vaginal vault suspension The cardinal ligaments were intact again improving postoperative vaginal support Pubocervical rectovaginal and posterior peritoneum were all included in the vaginal closure There was good result hemostasis   At the end of the procedure all the pedicles were identified and found to be hemostatic Both ureters were identified and undergoing normal peristalsis, they were well out of the way of surgical field throughout   I then got up from the console and went back to the side of the patient after gowning and gloving sterilely  I placed a  transversus abdominus plane block was placed using laparoscopic guidance at T10 and T7 bilaterally, 10 cc of 0.25% bupivacaine  at each site, 40 cc total of 100 mg of bupivacaine .  Doyle's bubble technique was used. This was placed for post operative pain management.       The 4 robotic ports were removed The insufflation ports were used to actively desufflate the peritoneal cavity to a pressure of 0 Ports were  then removed   The subcutaneous tissue of all 4 incisions was closed using 0 Vicryl All 4 skin incisions were closed using 3-0 vicryl in a subcuticular manner Dermabond was used all 4 incision sites  Each incision was dressed   The patient tolerated the procedure well She received 2 g of Ancef  and 30 mg of Toradol  preoperatively   EBL: 50 cc   Vonn VEAR Inch, MD 10/01/2024 11:13 AM

## 2024-10-01 NOTE — Anesthesia Preprocedure Evaluation (Signed)
 Anesthesia Evaluation  Patient identified by MRN, date of birth, ID band Patient awake    Reviewed: Allergy & Precautions, H&P , NPO status , Patient's Chart, lab work & pertinent test results, reviewed documented beta blocker date and time   Airway Mallampati: II  TM Distance: >3 FB Neck ROM: full    Dental no notable dental hx.    Pulmonary neg pulmonary ROS, Current Smoker and Patient abstained from smoking.   Pulmonary exam normal breath sounds clear to auscultation       Cardiovascular Exercise Tolerance: Good hypertension, negative cardio ROS  Rhythm:regular Rate:Normal     Neuro/Psych  PSYCHIATRIC DISORDERS Anxiety Depression    negative neurological ROS  negative psych ROS   GI/Hepatic negative GI ROS, Neg liver ROS,,,(+) Hepatitis -  Endo/Other  negative endocrine ROS    Renal/GU negative Renal ROS  negative genitourinary   Musculoskeletal   Abdominal   Peds  Hematology negative hematology ROS (+)   Anesthesia Other Findings   Reproductive/Obstetrics negative OB ROS                              Anesthesia Physical Anesthesia Plan  ASA: 2  Anesthesia Plan: MAC   Post-op Pain Management:    Induction:   PONV Risk Score and Plan: Propofol  infusion  Airway Management Planned:   Additional Equipment:   Intra-op Plan:   Post-operative Plan:   Informed Consent: I have reviewed the patients History and Physical, chart, labs and discussed the procedure including the risks, benefits and alternatives for the proposed anesthesia with the patient or authorized representative who has indicated his/her understanding and acceptance.     Dental Advisory Given  Plan Discussed with: CRNA  Anesthesia Plan Comments:         Anesthesia Quick Evaluation

## 2024-10-01 NOTE — OR Nursing (Signed)
 Patient arrived late and hard IV stick. Delay in getting to OR

## 2024-10-01 NOTE — H&P (Signed)
 Preoperative History and Physical  Alyssa Norris is a 53 y.o. [id number] with Patient's last menstrual period was 09/23/2024 (approximate). admitted for a RA TLH + BSO for 11 cm left ovarian cyst.  She is perimenopausal and her ROMA postmenopausal testing was normal, slightly elevated in premenopausal.  She is 53. I discussed with GYN oncology who recommended I could proceed with the surgery but would need to obtain washings.  PMH:    Past Medical History:  Diagnosis Date   Anxiety associated with depression    BV (bacterial vaginosis)    Drug abuse (HCC)    Hepatitis C    HSV (herpes simplex virus) infection    Overdose of heroin (HCC)     PSH:     Past Surgical History:  Procedure Laterality Date   CHOLECYSTECTOMY     TUMOR REMOVAL     abdomen @ age 3    POb/GynH:      OB History     Gravida  3   Para  2   Term  2   Preterm      AB  1   Living  2      SAB      IAB  1   Ectopic      Multiple      Live Births              SH:   Social History   Tobacco Use   Smoking status: Some Days    Current packs/day: 0.50    Types: Cigarettes, E-cigarettes, Cigars   Smokeless tobacco: Never  Vaping Use   Vaping status: Never Used  Substance Use Topics   Alcohol use: Not Currently    Comment: 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   Drug use: Yes    Types: Marijuana, Fentanyl , Methamphetamines, Cocaine, Heroin    Comment: last use 02/16/22    FH:    Family History  Problem Relation Age of Onset   Diabetes Mother    Hypertension Mother    Depression Mother    Cancer Father        testicular   Depression Maternal Grandmother    Heart disease Maternal Grandmother    Hypertension Maternal Grandmother    Cancer Paternal Grandfather      Allergies:  Allergies  Allergen Reactions   Bactrim [Sulfamethoxazole-Trimethoprim] Other (See Comments)    Could not tolerate   Coconut (Cocos Nucifera) Hives   Erythromycin Hives   Penicillins Hives     Medications:       Current Facility-Administered Medications:    ceFAZolin  (ANCEF ) IVPB 2g/100 mL premix, 2 g, Intravenous, On Call to OR, Jayne Vonn DEL, MD   ketorolac  (TORADOL ) 30 MG/ML injection 30 mg, 30 mg, Intravenous, On Call to OR, Jayne Vonn DEL, MD   povidone-iodine  10 % swab 2 Application, 2 Application, Topical, Once, Jayne Vonn DEL, MD  Review of Systems:   Review of Systems  Constitutional: Negative for fever, chills, weight loss, malaise/fatigue and diaphoresis.  HENT: Negative for hearing loss, ear pain, nosebleeds, congestion, sore throat, neck pain, tinnitus and ear discharge.   Eyes: Negative for blurred vision, double vision, photophobia, pain, discharge and redness.  Respiratory: Negative for cough, hemoptysis, sputum production, shortness of breath, wheezing and stridor.   Cardiovascular: Negative for chest pain, palpitations, orthopnea, claudication, leg swelling and PND.  Gastrointestinal: Positive for abdominal pain. Negative for heartburn, nausea, vomiting, diarrhea, constipation, blood in stool and melena.  Genitourinary: Negative for  dysuria, urgency, frequency, hematuria and flank pain.  Musculoskeletal: Negative for myalgias, back pain, joint pain and falls.  Skin: Negative for itching and rash.  Neurological: Negative for dizziness, tingling, tremors, sensory change, speech change, focal weakness, seizures, loss of consciousness, weakness and headaches.  Endo/Heme/Allergies: Negative for environmental allergies and polydipsia. Does not bruise/bleed easily.  Psychiatric/Behavioral: Negative for depression, suicidal ideas, hallucinations, memory loss and substance abuse. The patient is not nervous/anxious and does not have insomnia.      PHYSICAL EXAM:  Blood pressure 110/74, pulse 74, temperature 98 F (36.7 C), temperature source Oral, resp. rate 18, last menstrual period 09/23/2024, SpO2 97%.    Vitals reviewed. Constitutional: She is oriented to  person, place, and time. She appears well-developed and well-nourished.  HENT:  Head: Normocephalic and atraumatic.  Right Ear: External ear normal.  Left Ear: External ear normal.  Nose: Nose normal.  Mouth/Throat: Oropharynx is clear and moist.  Eyes: Conjunctivae and EOM are normal. Pupils are equal, round, and reactive to light. Right eye exhibits no discharge. Left eye exhibits no discharge. No scleral icterus.  Neck: Normal range of motion. Neck supple. No tracheal deviation present. No thyromegaly present.  Cardiovascular: Normal rate, regular rhythm, normal heart sounds and intact distal pulses.  Exam reveals no gallop and no friction rub.   No murmur heard. Respiratory: Effort normal and breath sounds normal. No respiratory distress. She has no wheezes. She has no rales. She exhibits no tenderness.  GI: Soft. Bowel sounds are normal. She exhibits no distension and no mass. There is tenderness. There is no rebound and no guarding.  Genitourinary:       Vulva is normal without lesions Vagina is pink moist without discharge Cervix normal in appearance and pap is normal Uterus is normal size, contour, position, consistency, mobility, non-tender Adnexa is per sonogram Musculoskeletal: Normal range of motion. She exhibits no edema and no tenderness.  Neurological: She is alert and oriented to person, place, and time. She has normal reflexes. She displays normal reflexes. No cranial nerve deficit. She exhibits normal muscle tone. Coordination normal.  Skin: Skin is warm and dry. No rash noted. No erythema. No pallor.  Psychiatric: She has a normal mood and affect. Her behavior is normal. Judgment and thought content normal.    Labs: Results for orders placed or performed during the hospital encounter of 09/27/24 (from the past 2 weeks)  Type and screen   Collection Time: 09/27/24  9:49 AM  Result Value Ref Range   ABO/RH(D) A NEG    Antibody Screen NEG    Sample Expiration  10/11/2024,2359    Extend sample reason      NO TRANSFUSIONS OR PREGNANCY IN THE PAST 3 MONTHS Performed at [facility], [address], [place], [place] [address]   CBC   Collection Time: 09/27/24  9:50 AM  Result Value Ref Range   WBC 6.4 4.0 - 10.5 K/uL   RBC 4.56 3.87 - 5.11 MIL/uL   Hemoglobin 13.6 12.0 - 15.0 g/dL   HCT 58.9 63.9 - 53.9 %   MCV 89.9 80.0 - 100.0 fL   MCH 29.8 26.0 - 34.0 pg   MCHC 33.2 30.0 - 36.0 g/dL   RDW 87.5 88.4 - 84.4 %   Platelets 294 150 - 400 K/uL   nRBC 0.0 0.0 - 0.2 %  Comprehensive metabolic panel   Collection Time: 09/27/24  9:50 AM  Result Value Ref Range   Sodium 140 135 - 145 mmol/L  Potassium 3.7 3.5 - 5.1 mmol/L   Chloride 106 98 - 111 mmol/L   CO2 24 22 - 32 mmol/L   Glucose, Bld 95 70 - 99 mg/dL   BUN 10 6 - 20 mg/dL   Creatinine, Ser 9.47 0.44 - 1.00 mg/dL   Calcium 8.5 (L) 8.9 - 10.3 mg/dL   Total Protein 6.9 6.5 - 8.1 g/dL   Albumin 4.0 3.5 - 5.0 g/dL   AST 20 15 - 41 U/L   ALT 10 0 - 44 U/L   Alkaline Phosphatase 90 38 - 126 U/L   Total Bilirubin 0.3 0.0 - 1.2 mg/dL   GFR, Estimated >39 >39 mL/min   Anion gap 10 5 - 15  Rapid HIV screen (HIV 1/2 Ab+Ag)   Collection Time: 09/27/24  9:50 AM  Result Value Ref Range   HIV-1 P24 Antigen - HIV24 NON REACTIVE NON REACTIVE   HIV 1/2 Antibodies NON REACTIVE NON REACTIVE   Interpretation (HIV Ag Ab)      A non reactive test result means that HIV 1 or HIV 2 antibodies and HIV 1 p24 antigen were not detected in the specimen.  Urinalysis, Routine w reflex microscopic -Urine, Clean Catch   Collection Time: 09/27/24  9:50 AM  Result Value Ref Range   Color, Urine YELLOW YELLOW   APPearance HAZY (A) CLEAR   Specific Gravity, Urine 1.021 1.005 - 1.030   pH 5.0 5.0 - 8.0   Glucose, UA NEGATIVE NEGATIVE mg/dL   Hgb urine dipstick MODERATE (A) NEGATIVE   Bilirubin Urine NEGATIVE NEGATIVE   Ketones, ur NEGATIVE NEGATIVE mg/dL   Protein, ur 30 (A) NEGATIVE mg/dL   Nitrite NEGATIVE  NEGATIVE   Leukocytes,Ua LARGE (A) NEGATIVE   RBC / HPF 6-10 0 - 5 RBC/hpf   WBC, UA 21-50 0 - 5 WBC/hpf   Bacteria, UA RARE (A) NONE SEEN   Squamous Epithelial / HPF 11-20 0 - 5 /HPF   Mucus PRESENT   Pregnancy, urine   Collection Time: 09/27/24  9:50 AM  Result Value Ref Range   Preg Test, Ur NEGATIVE NEGATIVE    EKG: Orders placed or performed during the hospital encounter of 02/19/22   EKG 12-Lead   EKG 12-Lead   ED EKG   ED EKG   EKG    Imaging Studies: Narrative & Impression  CLINICAL DATA:  Initial evaluation for irregular shaped uterus. Patient is postmenopausal per provided history.   EXAM: TRANSABDOMINAL AND TRANSVAGINAL ULTRASOUND OF PELVIS   TECHNIQUE: Both transabdominal and transvaginal ultrasound examinations of the pelvis were performed. Transabdominal technique was performed for global imaging of the pelvis including uterus, ovaries, adnexal regions, and pelvic cul-de-sac. It was necessary to proceed with endovaginal exam following the transabdominal exam to visualize the endometrium.   COMPARISON:  None Available.   FINDINGS: Uterus   Measurements: 9.1 x 4.1 x 5.3 cm = volume: 103.0 mL. Heterogeneous echotexture within the uterine myometrium. No visible discrete fibroid or other myometrial abnormality.   Endometrium   Thickness: 8.3 mm.  No focal abnormality visualized.   Right ovary   Not visualized.  No right adnexal mass.   Left ovary   The native left ovary is not visualized. There is a large cyst within the left adnexa measuring 10.5 x 6.0 x 9.4 cm. Cyst is predominantly simple in appearance, although there is some layering echogenic material within, which could reflect proteinaceous material and/or debris. No visible solid nodularity or vascularity.   Other findings  No abnormal free fluid.   IMPRESSION: 1. Large 10.5 cm left adnexal cyst. Finding is indeterminate, but could reflect a cystic ovarian neoplasm. Recommend GYN  consult, and consider pelvis MRI w/o and w/ contrast if clinically warranted. Note: This recommendation does not apply to premenarchal patients or to those with increased risk (genetic, family history, elevated tumor markers or other high-risk factors) of ovarian cancer. Reference: Radiology 2019 Nov; 293(2):359-371. 2. Endometrial stripe measures 8.3 mm in thickness, considered abnormal for an asymptomatic post-menopausal female. Endometrial sampling should be considered to exclude carcinoma. 3. Nonvisualization of either native ovary.  No free fluid. 4. Normal sonographic appearance of the uterus.   These results will be called to the ordering clinician or representative by the Radiologist Assistant, and communication documented in the PACS or [employer].     Electronically Signed   By: Morene Hoard M.D.   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

## 2024-10-02 ENCOUNTER — Encounter (HOSPITAL_COMMUNITY): Payer: Self-pay | Admitting: Obstetrics & Gynecology

## 2024-10-04 NOTE — Anesthesia Postprocedure Evaluation (Signed)
 Anesthesia Post Note  Patient: VERLA BRYNGELSON  Procedure(s) Performed: HYSTERECTOMY, TOTAL, BILATERAL SAPLINGO-OOPHORECTOMY, ROBOT ASSISTED, LAPAROSCOPIC, D5 (Abdomen)  Patient location during evaluation: Phase II Anesthesia Type: MAC Level of consciousness: awake Pain management: pain level controlled Vital Signs Assessment: post-procedure vital signs reviewed and stable Respiratory status: spontaneous breathing and respiratory function stable Cardiovascular status: blood pressure returned to baseline and stable Postop Assessment: no headache and no apparent nausea or vomiting Anesthetic complications: no Comments: Late entry   No notable events documented.   Last Vitals:  Vitals:   10/01/24 1300 10/01/24 1323  BP: 117/73 123/75  Pulse: (!) 50 68  Resp: 13 12  Temp:  36.6 C  SpO2: 99% 100%    Last Pain:  Vitals:   10/02/24 1413  TempSrc:   PainSc: 6                  Yvonna JINNY Bosworth

## 2024-10-07 ENCOUNTER — Encounter: Payer: Self-pay | Admitting: Obstetrics & Gynecology

## 2024-10-07 LAB — SURGICAL PATHOLOGY

## 2024-10-07 MED ORDER — BUPRENORPHINE HCL-NALOXONE HCL 8-2 MG SL FILM: 1.0000 | ORAL_FILM | Freq: Two times a day (BID) | SUBLINGUAL | 0 refills | Status: DC

## 2024-10-14 ENCOUNTER — Encounter: Payer: Self-pay | Admitting: Obstetrics & Gynecology

## 2024-10-14 ENCOUNTER — Other Ambulatory Visit: Payer: Self-pay | Admitting: Obstetrics & Gynecology

## 2024-10-25 ENCOUNTER — Ambulatory Visit: Payer: MEDICAID | Admitting: Obstetrics & Gynecology

## 2024-10-25 VITALS — BP 109/73 | HR 78 | Ht 66.0 in | Wt 149.0 lb

## 2024-10-25 DIAGNOSIS — Z48816 Encounter for surgical aftercare following surgery on the genitourinary system: Secondary | ICD-10-CM

## 2024-10-25 DIAGNOSIS — Z9889 Other specified postprocedural states: Secondary | ICD-10-CM

## 2024-10-25 MED ORDER — POLYETHYLENE GLYCOL 3350 17 GM/SCOOP PO POWD: ORAL | 11 refills | Status: AC

## 2024-10-25 NOTE — Progress Notes (Signed)
" °  HPI: Patient returns for routine postoperative follow-up having undergone   No diagnosis found.   The patient's immediate postoperative recovery has been unremarkable. Since hospital discharge the patient reports no problems.   Current Outpatient Medications: estradiol (CLIMARA - DOSED IN MG/24 HR) 0.05 mg/24hr patch, Place 0.05 mg onto the skin 2 (two) times a week., Disp: , Rfl:  Multiple Vitamin (MULTIVITAMIN WITH MINERALS) TABS tablet, Take 1 tablet by mouth daily., Disp: , Rfl:  Multiple Vitamins-Minerals (HAIR SKIN & NAILS) TABS, Take 1 tablet by mouth daily., Disp: , Rfl:  polyethylene glycol powder (GLYCOLAX /MIRALAX ) 17 GM/SCOOP powder, 1 scoop daily or as needed, Disp: 255 g, Rfl: 11 progesterone (PROMETRIUM) 100 MG capsule, Take 100 mg by mouth at bedtime., Disp: , Rfl:  SUBOXONE  8-2 MG FILM, Place 1 Film under the tongue in the morning and at bedtime., Disp: 14 each, Rfl: 0 busPIRone (BUSPAR) 10 MG tablet, Take 10 mg by mouth 2 (two) times daily. (Patient not taking: Reported on 10/25/2024), Disp: , Rfl:  ciprofloxacin  (CIPRO ) 500 MG tablet, Take 1 tablet (500 mg total) by mouth 2 (two) times daily. (Patient not taking: Reported on 10/25/2024), Disp: 14 tablet, Rfl: 0 ketorolac  (TORADOL ) 10 MG tablet, Take 1 tablet (10 mg total) by mouth every 8 (eight) hours as needed. (Patient not taking: Reported on 10/25/2024), Disp: 15 tablet, Rfl: 0 ondansetron  (ZOFRAN -ODT) 8 MG disintegrating tablet, Take 1 tablet (8 mg total) by mouth every 8 (eight) hours as needed for nausea or vomiting. (Patient not taking: Reported on 10/25/2024), Disp: 8 tablet, Rfl: 0 oxyCODONE -acetaminophen  (PERCOCET) 7.5-325 MG tablet, Take 1 tablet by mouth every 6 (six) hours as needed. (Patient not taking: Reported on 10/25/2024), Disp: 28 tablet, Rfl: 0  No current facility-administered medications for this visit.    Blood pressure 109/73, pulse 78, height 5' 6 (1.676 m), weight 149 lb (67.6 kg), last  menstrual period 09/23/2024.  Physical Exam: 4 incisions all look good Abdomen is benign  Diagnostic Tests:   Pathology: benign  Impression + Management plan:   ICD-10-CM   1. Post-operative state: RA TLH BSO 10/01/24  Z98.890           Medications Prescribed this encounter: Orders Placed This Encounter     polyethylene glycol powder (GLYCOLAX /MIRALAX ) 17 GM/SCOOP powder         Sig: 1 scoop daily or as needed         Dispense:  255 g         Refill:  11     Follow up: 6 weeks   Vonn VEAR Inch, MD Attending Physician for the [facility] and [facility] 10/25/2024 12:16 PM     "

## 2024-11-15 ENCOUNTER — Encounter: Payer: Self-pay | Admitting: Obstetrics & Gynecology

## 2024-11-15 DIAGNOSIS — N83202 Unspecified ovarian cyst, left side: Secondary | ICD-10-CM

## 2024-11-18 MED ORDER — ESTRADIOL 0.05 MG/24HR TD PTWK: MEDICATED_PATCH | TRANSDERMAL | 12 refills | Status: AC

## 2024-11-28 ENCOUNTER — Other Ambulatory Visit: Payer: Self-pay | Admitting: Medical Genetics

## 2024-11-28 DIAGNOSIS — Z006 Encounter for examination for normal comparison and control in clinical research program: Secondary | ICD-10-CM

## 2024-12-10 ENCOUNTER — Ambulatory Visit: Payer: MEDICAID | Admitting: Obstetrics & Gynecology

## 2024-12-30 ENCOUNTER — Ambulatory Visit: Payer: MEDICAID | Admitting: Obstetrics & Gynecology
